# Patient Record
Sex: Female | Born: 1970 | Race: Black or African American | Hispanic: No | Smoking: Never smoker
Health system: Southern US, Community
[De-identification: ages and names within clinical notes are randomized; demographics above are authoritative.]

## PROBLEM LIST (undated history)

## (undated) ENCOUNTER — Emergency Department (HOSPITAL_BASED_OUTPATIENT_CLINIC_OR_DEPARTMENT_OTHER): Admission: EM | Payer: BLUE CROSS/BLUE SHIELD | Source: Home / Self Care

## (undated) DIAGNOSIS — N809 Endometriosis, unspecified: Secondary | ICD-10-CM

## (undated) DIAGNOSIS — M549 Dorsalgia, unspecified: Secondary | ICD-10-CM

## (undated) DIAGNOSIS — I2699 Other pulmonary embolism without acute cor pulmonale: Secondary | ICD-10-CM

## (undated) HISTORY — PX: BUNIONECTOMY: SHX129

## (undated) HISTORY — PX: HERNIA REPAIR: SHX51

## (undated) HISTORY — PX: LAPAROSCOPY: SHX197

## (undated) HISTORY — PX: ABDOMINAL HYSTERECTOMY: SHX81

---

## 2009-03-11 ENCOUNTER — Emergency Department (HOSPITAL_BASED_OUTPATIENT_CLINIC_OR_DEPARTMENT_OTHER): Admission: EM | Admit: 2009-03-11 | Discharge: 2009-03-11 | Payer: Self-pay | Admitting: Emergency Medicine

## 2009-03-11 ENCOUNTER — Ambulatory Visit: Payer: Self-pay | Admitting: Radiology

## 2009-09-19 ENCOUNTER — Ambulatory Visit: Payer: Self-pay | Admitting: Radiology

## 2009-09-19 ENCOUNTER — Emergency Department (HOSPITAL_BASED_OUTPATIENT_CLINIC_OR_DEPARTMENT_OTHER): Admission: EM | Admit: 2009-09-19 | Discharge: 2009-09-19 | Payer: Self-pay | Admitting: Emergency Medicine

## 2009-11-13 ENCOUNTER — Emergency Department (HOSPITAL_BASED_OUTPATIENT_CLINIC_OR_DEPARTMENT_OTHER): Admission: EM | Admit: 2009-11-13 | Discharge: 2009-11-13 | Payer: Self-pay | Admitting: Emergency Medicine

## 2010-08-10 LAB — GLUCOSE, CAPILLARY
Glucose-Capillary: 102 mg/dL — ABNORMAL HIGH (ref 70–99)
Glucose-Capillary: 96 mg/dL (ref 70–99)

## 2010-08-10 LAB — POCT CARDIAC MARKERS
CKMB, poc: 1.7 ng/mL (ref 1.0–8.0)
Myoglobin, poc: 127 ng/mL (ref 12–200)
Troponin i, poc: <0.05 ng/mL (ref 0.00–0.09)

## 2010-08-10 LAB — BASIC METABOLIC PANEL
BUN: 11 mg/dL (ref 6–23)
Calcium: 9.7 mg/dL (ref 8.4–10.5)
Creatinine, Ser: 0.7 mg/dL (ref 0.4–1.2)
GFR calc non Af Amer: >60 mL/min (ref >60)
Potassium: 3.8 mEq/L (ref 3.5–5.1)

## 2010-08-10 LAB — URINALYSIS, ROUTINE W REFLEX MICROSCOPIC
Bilirubin Urine: NEGATIVE
Leukocytes, UA: NEGATIVE
pH: 6 (ref 5.0–8.0)

## 2010-08-10 LAB — URINE MICROSCOPIC-ADD ON

## 2010-08-10 LAB — POCT TOXICOLOGY PANEL

## 2010-08-10 LAB — CBC
HCT: 37.7 % (ref 36.0–46.0)
MCHC: 32.8 g/dL (ref 30.0–36.0)
RBC: 4.38 MIL/uL (ref 3.87–5.11)
WBC: 6.9 10*3/uL (ref 4.0–10.5)

## 2010-08-10 LAB — URINE CULTURE

## 2010-08-10 LAB — DIFFERENTIAL
Eosinophils Absolute: 0 10*3/uL (ref 0.0–0.7)
Lymphs Abs: 2.5 10*3/uL (ref 0.7–4.0)
Monocytes Absolute: 0.5 10*3/uL (ref 0.1–1.0)
Monocytes Relative: 8 % (ref 3–12)

## 2010-09-18 ENCOUNTER — Emergency Department (HOSPITAL_BASED_OUTPATIENT_CLINIC_OR_DEPARTMENT_OTHER)
Admission: EM | Admit: 2010-09-18 | Discharge: 2010-09-18 | Disposition: A | Discharge status: Home / Self Care | Payer: Medicaid Other | Attending: Emergency Medicine | Admitting: Emergency Medicine

## 2010-09-18 ENCOUNTER — Emergency Department (INDEPENDENT_AMBULATORY_CARE_PROVIDER_SITE_OTHER): Payer: Medicaid Other

## 2010-09-18 DIAGNOSIS — M25579 Pain in unspecified ankle and joints of unspecified foot: Secondary | ICD-10-CM | POA: Insufficient documentation

## 2010-09-18 DIAGNOSIS — G8929 Other chronic pain: Secondary | ICD-10-CM | POA: Insufficient documentation

## 2010-09-18 DIAGNOSIS — M79609 Pain in unspecified limb: Secondary | ICD-10-CM

## 2011-05-29 ENCOUNTER — Encounter: Payer: Self-pay | Admitting: *Deleted

## 2011-05-29 ENCOUNTER — Emergency Department (HOSPITAL_BASED_OUTPATIENT_CLINIC_OR_DEPARTMENT_OTHER)
Admission: EM | Admit: 2011-05-29 | Discharge: 2011-05-29 | Disposition: A | Discharge status: Home / Self Care | Payer: Self-pay | Attending: Emergency Medicine | Admitting: Emergency Medicine

## 2011-05-29 DIAGNOSIS — G43909 Migraine, unspecified, not intractable, without status migrainosus: Secondary | ICD-10-CM | POA: Insufficient documentation

## 2011-05-29 MED ORDER — PROMETHAZINE HCL 25 MG/ML IJ SOLN
25.0000 mg | Freq: Once | INTRAMUSCULAR | Status: AC
  Administered 2011-05-29: 25 mg via INTRAVENOUS
  Filled 2011-05-29 (×2): qty 1

## 2011-05-29 MED ORDER — SODIUM CHLORIDE 0.9 % IV SOLN: INTRAVENOUS | Status: DC

## 2011-05-29 MED ORDER — METHYLPREDNISOLONE SODIUM SUCC 125 MG IJ SOLR
125.0000 mg | Freq: Once | INTRAMUSCULAR | Status: AC
  Administered 2011-05-29: 125 mg via INTRAVENOUS
  Filled 2011-05-29: qty 2

## 2011-05-29 MED ORDER — DIPHENHYDRAMINE HCL 50 MG/ML IJ SOLN
25.0000 mg | Freq: Once | INTRAMUSCULAR | Status: AC
  Administered 2011-05-29: 25 mg via INTRAVENOUS
  Filled 2011-05-29: qty 1

## 2011-05-29 MED ORDER — SODIUM CHLORIDE 0.9 % IV BOLUS (SEPSIS)
1000.0000 mL | Freq: Once | INTRAVENOUS | Status: AC
  Administered 2011-05-29: 1000 mL via INTRAVENOUS

## 2011-05-29 MED ORDER — IBUPROFEN 800 MG PO TABS: 800.0000 mg | ORAL_TABLET | Freq: Three times a day (TID) | ORAL | Status: AC | PRN

## 2011-05-29 NOTE — ED Notes (Signed)
Pt has hx of "paraplegic migraine" Had to be hospitalized for same. Has had this one for 4 days+ nausea

## 2011-05-29 NOTE — ED Provider Notes (Signed)
History   This chart was scribed for Felisa Bonier, MD by Charolett Bumpers . The patient was seen in room MH07/MH07 and the patient's care was started at 8:58pm.  CSN: [id number]  Arrival date & time 05/29/11  8469   First MD Initiated Contact with Patient 05/29/11 2018      Chief Complaint  Patient presents with  . Migraine    (Consider location/radiation/quality/duration/timing/severity/associated sxs/prior treatment) HPI Ellen Hood is a 41 y.o. female who presents to the Emergency Department complaining of a constant, moderate to severe headache with an onset of 4 days ago. Patient reports associated nausea. Patient denies weakness in extremities or speech deficits. Patient reports she is not taking any current medication for the headache. Patient reports having one dx paraplegic migraine last year. Patient also notes she has been without hormone replacement therapy for the past couple of weeks.     Past Medical History  Diagnosis Date  . Migraine     Past Surgical History  Procedure Date  . Abdominal hysterectomy   . Cesarean section   . Hernia repair   . Laparoscopy     History reviewed. No pertinent family history.  History  Substance Use Topics  . Smoking status: Never Smoker   . Smokeless tobacco: Not on file  . Alcohol Use: No    OB History    Grav Para Term Preterm Abortions TAB SAB Ect Mult Living                  Review of Systems A complete 10 system review of systems was obtained and is otherwise negative except as noted in the HPI and PMH.   Allergies  Review of patient's allergies indicates no known allergies.  Home Medications   Current Outpatient Rx  Name Route Sig Dispense Refill  . DIPHENHYDRAMINE HCL 25 MG PO TABS Oral Take 25 mg by mouth at bedtime.      . IBUPROFEN 200 MG PO TABS Oral Take 800 mg by mouth every 6 (six) hours as needed. For pain     . ADULT MULTIVITAMIN W/MINERALS CH Oral Take 1 tablet by mouth daily.       Marland Kitchen VITAMIN B-12 1000 MCG PO TABS Oral Take 1,000 mcg by mouth daily.      Marland Kitchen ESTRADIOL ACETATE 0.05 MG/24HR VA RING Vaginal Place 1 each vaginally every 3 (three) months.        BP 116/73  Pulse 80  Temp(Src) 98.3 F (36.8 C) (Oral)  Resp 20  Ht 5\' 6"  (1.676 m)  Wt 190 lb (86.183 kg)  BMI 30.67 kg/m2  SpO2 98%  Physical Exam  Nursing note and vitals reviewed. Constitutional: She is oriented to person, place, and time. She appears well-developed and well-nourished. No distress.  HENT:  Head: Normocephalic and atraumatic.  Eyes: EOM are normal. Pupils are equal, round, and reactive to light.  Fundoscopic exam:      The right eye shows no papilledema.       The left eye shows no papilledema.  Neck: Neck supple. No tracheal deviation present.  Cardiovascular: Normal rate.   Pulmonary/Chest: Effort normal. No respiratory distress.  Abdominal: Soft. She exhibits no distension.  Musculoskeletal: Normal range of motion. She exhibits no edema.  Neurological: She is alert and oriented to person, place, and time. She has normal reflexes. No cranial nerve deficit or sensory deficit.       Equal grip strength bilaterally, normal symmetrically bicep and patellar reflexes. Normal  finger nose finger test bilaterally. Normal coordination with heel to shin test. Negative Romberg's.    Skin: Skin is warm and dry.  Psychiatric: She has a normal mood and affect. Her behavior is normal.    ED Course  Procedures (including critical care time)  DIAGNOSTIC STUDIES: Oxygen Saturation is 98% on room air, normal by my interpretation.    COORDINATION OF CARE:  10:48 PM The patient reports significant improvement in her headache at this time and reports that it present it is approximately a 5/10 in intensity, tolerable for the patient. She feels as though she is ready to go home and rest. I have instructed her to call her neurologist's office tomorrow to discuss migraine specialty medications to be  prescribed. The patient states her understanding of and agreement with the plan of care.   Labs Reviewed - No data to display No results found.   No diagnosis found.    MDM  Patient with a h/o migraine headaches. Non-focal neurologic exam. No CT scan of the head is needed. I will attempt to break the patient's migraine with rescue medications.     I personally performed the services described in this documentation, which was scribed in my presence. The recorded information has been reviewed and considered.      Felisa Bonier, MD 05/29/11 2249

## 2013-02-25 ENCOUNTER — Encounter (HOSPITAL_BASED_OUTPATIENT_CLINIC_OR_DEPARTMENT_OTHER): Payer: Self-pay

## 2013-02-25 ENCOUNTER — Emergency Department (HOSPITAL_BASED_OUTPATIENT_CLINIC_OR_DEPARTMENT_OTHER)
Admission: EM | Admit: 2013-02-25 | Discharge: 2013-02-25 | Disposition: A | Discharge status: Home / Self Care | Payer: Medicaid Other | Attending: Emergency Medicine | Admitting: Emergency Medicine

## 2013-02-25 DIAGNOSIS — W57XXXA Bitten or stung by nonvenomous insect and other nonvenomous arthropods, initial encounter: Secondary | ICD-10-CM | POA: Insufficient documentation

## 2013-02-25 DIAGNOSIS — S1096XA Insect bite of unspecified part of neck, initial encounter: Secondary | ICD-10-CM | POA: Insufficient documentation

## 2013-02-25 DIAGNOSIS — Z8679 Personal history of other diseases of the circulatory system: Secondary | ICD-10-CM | POA: Insufficient documentation

## 2013-02-25 DIAGNOSIS — Y929 Unspecified place or not applicable: Secondary | ICD-10-CM | POA: Insufficient documentation

## 2013-02-25 DIAGNOSIS — R21 Rash and other nonspecific skin eruption: Secondary | ICD-10-CM | POA: Insufficient documentation

## 2013-02-25 DIAGNOSIS — L089 Local infection of the skin and subcutaneous tissue, unspecified: Secondary | ICD-10-CM

## 2013-02-25 DIAGNOSIS — Y939 Activity, unspecified: Secondary | ICD-10-CM | POA: Insufficient documentation

## 2013-02-25 MED ORDER — CEPHALEXIN 500 MG PO CAPS: 500.0000 mg | ORAL_CAPSULE | Freq: Four times a day (QID) | ORAL | Status: DC

## 2013-02-25 NOTE — ED Notes (Signed)
States she was bitten by a bug on right side of neck Saturday.  Redness and pain at site.

## 2013-02-25 NOTE — ED Provider Notes (Addendum)
CSN: [id number]     Arrival date & time 02/25/13  0808 History   First MD Initiated Contact with Patient 02/25/13 [phone]     Chief Complaint  Patient presents with  . Insect Bite   (Consider location/radiation/quality/duration/timing/severity/associated sxs/prior Treatment) Patient is a 42 y.o. female presenting with rash.  Rash Location:  Head/neck Head/neck rash location:  R neck Quality: painful and redness   Pain details:    Quality:  Itching and sharp   Severity:  Moderate   Onset quality:  Gradual   Duration:  2 days   Timing:  Constant   Progression:  Worsening Severity:  Moderate Onset quality:  Gradual Duration:  2 days Timing:  Constant Progression:  Worsening Chronicity:  New Context comment:  Insect bite Relieved by:  Nothing Worsened by:  Nothing tried Ineffective treatments: Benadryl. Associated symptoms: no abdominal pain, no diarrhea, no fever, no hoarse voice, no nausea, no shortness of breath, no sore throat, no throat swelling and not vomiting     Past Medical History  Diagnosis Date  . Migraine    Past Surgical History  Procedure Laterality Date  . Abdominal hysterectomy    . Cesarean section    . Hernia repair    . Laparoscopy     No family history on file. History  Substance Use Topics  . Smoking status: Never Smoker   . Smokeless tobacco: Not on file  . Alcohol Use: No   OB History   Grav Para Term Preterm Abortions TAB SAB Ect Mult Living                 Review of Systems  Constitutional: Negative for fever.  HENT: Negative for congestion, sore throat and hoarse voice.   Respiratory: Negative for cough and shortness of breath.   Cardiovascular: Negative for chest pain.  Gastrointestinal: Negative for nausea, vomiting, abdominal pain and diarrhea.  Skin: Positive for rash.  All other systems reviewed and are negative.    Allergies  Review of patient's allergies indicates no known allergies.  Home Medications   Current  Outpatient Rx  Name  Route  Sig  Dispense  Refill  . diphenhydrAMINE (BENADRYL) 25 MG tablet   Oral   Take 25 mg by mouth at bedtime.           Marland Kitchen ibuprofen (ADVIL,MOTRIN) 200 MG tablet   Oral   Take 800 mg by mouth every 6 (six) hours as needed. For pain          . Multiple Vitamin (MULITIVITAMIN WITH MINERALS) TABS   Oral   Take 1 tablet by mouth daily.           . vitamin B-12 (CYANOCOBALAMIN) 1000 MCG tablet   Oral   Take 1,000 mcg by mouth daily.            BP 122/75  Pulse 76  Temp(Src) 98.3 F (36.8 C) (Oral)  Resp 18  SpO2 100% Physical Exam  Nursing note and vitals reviewed. Constitutional: She is oriented to person, place, and time. She appears well-developed and well-nourished. No distress.  HENT:  Head: Normocephalic and atraumatic.    Eyes: Conjunctivae are normal. No scleral icterus.  Neck: Neck supple.  Cardiovascular: Normal rate and intact distal pulses.   Pulmonary/Chest: Effort normal. No stridor. No respiratory distress.  Abdominal: Normal appearance. She exhibits no distension.  Neurological: She is alert and oriented to person, place, and time.  Skin: Skin is warm and dry. No rash noted.  Psychiatric: She has a normal mood and affect. Her behavior is normal.    ED Course  Procedures (including critical care time) Labs Review Labs Reviewed - No data to display Imaging Review No results found.  MDM   1. Insect bite of neck, infected, initial encounter    42 year old female with insect bite 2 days ago now presenting with worsening swelling and redness. No fevers. On exam head is mild erythema spreading from the area. Will treat with Keflex.    Candyce Churn, MD 02/25/13 1610  Candyce Churn, MD 03/05/13 [phone]

## 2013-05-02 ENCOUNTER — Emergency Department (HOSPITAL_BASED_OUTPATIENT_CLINIC_OR_DEPARTMENT_OTHER)
Admission: EM | Admit: 2013-05-02 | Discharge: 2013-05-02 | Disposition: A | Discharge status: Home / Self Care | Payer: Self-pay | Attending: Emergency Medicine | Admitting: Emergency Medicine

## 2013-05-02 ENCOUNTER — Emergency Department (HOSPITAL_BASED_OUTPATIENT_CLINIC_OR_DEPARTMENT_OTHER): Payer: Self-pay

## 2013-05-02 ENCOUNTER — Encounter (HOSPITAL_BASED_OUTPATIENT_CLINIC_OR_DEPARTMENT_OTHER): Payer: Self-pay | Admitting: Emergency Medicine

## 2013-05-02 DIAGNOSIS — R071 Chest pain on breathing: Secondary | ICD-10-CM | POA: Insufficient documentation

## 2013-05-02 DIAGNOSIS — G43909 Migraine, unspecified, not intractable, without status migrainosus: Secondary | ICD-10-CM | POA: Insufficient documentation

## 2013-05-02 DIAGNOSIS — R0789 Other chest pain: Secondary | ICD-10-CM

## 2013-05-02 DIAGNOSIS — Z792 Long term (current) use of antibiotics: Secondary | ICD-10-CM | POA: Insufficient documentation

## 2013-05-02 DIAGNOSIS — Z79899 Other long term (current) drug therapy: Secondary | ICD-10-CM | POA: Insufficient documentation

## 2013-05-02 LAB — COMPREHENSIVE METABOLIC PANEL
ALT: 20 U/L (ref 0–35)
AST: 17 U/L (ref 0–37)
Albumin: 3.4 g/dL — ABNORMAL LOW (ref 3.5–5.2)
Alkaline Phosphatase: 121 U/L — ABNORMAL HIGH (ref 39–117)
CO2: 26 mEq/L (ref 19–32)
GFR calc Af Amer: >90 mL/min (ref >90)
Glucose, Bld: 99 mg/dL (ref 70–99)
Potassium: 4.1 mEq/L (ref 3.5–5.1)
Sodium: 139 mEq/L (ref 135–145)
Total Protein: 8 g/dL (ref 6.0–8.3)

## 2013-05-02 LAB — CBC WITH DIFFERENTIAL/PLATELET
Eosinophils Relative: 2 % (ref 0–5)
Hemoglobin: 12.1 g/dL (ref 12.0–15.0)
Lymphocytes Relative: 37 % (ref 12–46)
Lymphs Abs: 2.8 10*3/uL (ref 0.7–4.0)
MCH: 28.3 pg (ref 26.0–34.0)
MCHC: 32.5 g/dL (ref 30.0–36.0)
MCV: 86.9 fL (ref 78.0–100.0)
Monocytes Relative: 9 % (ref 3–12)

## 2013-05-02 MED ORDER — HYDROCODONE-ACETAMINOPHEN 5-325 MG PO TABS: 2.0000 | ORAL_TABLET | ORAL | Status: DC | PRN

## 2013-05-02 MED ORDER — HYDROCODONE-ACETAMINOPHEN 5-325 MG PO TABS
2.0000 | ORAL_TABLET | Freq: Once | ORAL | Status: AC
  Administered 2013-05-02: 2 via ORAL
  Filled 2013-05-02: qty 2

## 2013-05-02 MED ORDER — IBUPROFEN 800 MG PO TABS: 800.0000 mg | ORAL_TABLET | Freq: Three times a day (TID) | ORAL | Status: DC

## 2013-05-02 NOTE — ED Notes (Signed)
Low central cp since yesterday.  Pain also in shoulders, worse in left, and upper back.  Constant. Nausea, no vomiting. Worse with deep breath and movement.  Tender to touch.

## 2013-05-02 NOTE — ED Provider Notes (Signed)
CSN: [id number]     Arrival date & time 05/02/13  1836 History   First MD Initiated Contact with Patient 05/02/13 1851     Chief Complaint  Patient presents with  . Chest Pain   (Consider location/radiation/quality/duration/timing/severity/associated sxs/prior Treatment) Patient is a 42 y.o. female presenting with chest pain. The history is provided by the patient. No language interpreter was used.  Chest Pain Pain location:  L chest Pain quality: aching   Pain radiates to:  L shoulder Pain radiates to the back: yes   Pain severity:  Moderate Duration:  1 day Timing:  Constant Progression:  Worsening Chronicity:  New Relieved by:  Nothing Worsened by:  Nothing tried Ineffective treatments:  None tried Associated symptoms: no abdominal pain   pain started yesterday.   Pt has some pain with movement.  Pt is in Nursing school doing clinicals.  Pt denies lifting or straining  Past Medical History  Diagnosis Date  . Migraine    Past Surgical History  Procedure Laterality Date  . Abdominal hysterectomy    . Cesarean section    . Hernia repair    . Laparoscopy     No family history on file. History  Substance Use Topics  . Smoking status: Never Smoker   . Smokeless tobacco: Not on file  . Alcohol Use: No   OB History   Grav Para Term Preterm Abortions TAB SAB Ect Mult Living                 Review of Systems  Cardiovascular: Positive for chest pain.  Gastrointestinal: Negative for abdominal pain.  All other systems reviewed and are negative.    Allergies  Review of patient's allergies indicates no known allergies.  Home Medications   Current Outpatient Rx  Name  Route  Sig  Dispense  Refill  . cephALEXin (KEFLEX) 500 MG capsule   Oral   Take 1 capsule (500 mg total) by mouth 4 (four) times daily.   28 capsule   0   . diphenhydrAMINE (BENADRYL) 25 MG tablet   Oral   Take 25 mg by mouth at bedtime.           Marland Kitchen ibuprofen (ADVIL,MOTRIN) 200 MG tablet  Oral   Take 800 mg by mouth every 6 (six) hours as needed. For pain          . Multiple Vitamin (MULITIVITAMIN WITH MINERALS) TABS   Oral   Take 1 tablet by mouth daily.           . vitamin B-12 (CYANOCOBALAMIN) 1000 MCG tablet   Oral   Take 1,000 mcg by mouth daily.            BP 127/93  Pulse 80  Temp(Src) 97.9 F (36.6 C) (Oral)  Resp 16  Ht 5' 5.5" (1.664 m)  Wt 189 lb (85.73 kg)  BMI 30.96 kg/m2  SpO2 99% Physical Exam  Nursing note and vitals reviewed. Constitutional: She is oriented to person, place, and time. She appears well-developed and well-nourished.  HENT:  Head: Normocephalic.  Right Ear: External ear normal.  Left Ear: External ear normal.  Mouth/Throat: Oropharynx is clear and moist.  Eyes: EOM are normal. Pupils are equal, round, and reactive to light.  Neck: Normal range of motion.  Cardiovascular: Normal rate.   Pulmonary/Chest: Effort normal. No respiratory distress.  Abdominal: Soft. She exhibits no distension.  Musculoskeletal: Normal range of motion.  Neurological: She is alert and oriented to person, place,  and time.  Skin: Skin is warm.  Psychiatric: She has a normal mood and affect.    ED Course  Procedures (including critical care time) Labs Review Labs Reviewed  CBC WITH DIFFERENTIAL  COMPREHENSIVE METABOLIC PANEL  TROPONIN I  D-DIMER, QUANTITATIVE   Imaging Review No results found.  EKG Interpretation    Date/Time:  Thursday May 02 2013 18:48:54 EST Ventricular Rate:  83 PR Interval:  140 QRS Duration: 78 QT Interval:  396 QTC Calculation: 465 R Axis:   62 Text Interpretation:  Normal sinus rhythm Nonspecific T wave abnormality Abnormal ECG No significant change since last tracing Confirmed by POLLINA  MD, CHRISTOPHER ([id number]) on 05/02/2013 7:48:36 PM            MDM   1. Chest wall pain     Pt has a normal ekg and chest xray.  ddimer is negative,  Troponin is negative,   I suspect muscular pain.   Pt  given hydrocodone.  I advised follow up with Dr. Roseanne Reno.   Pt given rx for ibuprofen and hydrocodone   Elson Areas, PA-C 05/02/13 2052

## 2013-05-02 NOTE — ED Notes (Signed)
Patient transported to X-ray via stretcher per tech. 

## 2013-05-02 NOTE — ED Notes (Signed)
PA at bedside.

## 2013-05-03 NOTE — ED Provider Notes (Signed)
Medical screening examination/treatment/procedure(s) were performed by non-physician practitioner and as supervising physician I was immediately available for consultation/collaboration.  EKG Interpretation   None          Gilda Crease, MD 05/03/13 2136

## 2013-05-23 ENCOUNTER — Encounter (HOSPITAL_BASED_OUTPATIENT_CLINIC_OR_DEPARTMENT_OTHER): Payer: Self-pay | Admitting: Emergency Medicine

## 2013-05-23 ENCOUNTER — Emergency Department (HOSPITAL_BASED_OUTPATIENT_CLINIC_OR_DEPARTMENT_OTHER)
Admission: EM | Admit: 2013-05-23 | Discharge: 2013-05-23 | Disposition: A | Discharge status: Home / Self Care | Payer: BC Managed Care – PPO | Attending: Emergency Medicine | Admitting: Emergency Medicine

## 2013-05-23 ENCOUNTER — Emergency Department (HOSPITAL_BASED_OUTPATIENT_CLINIC_OR_DEPARTMENT_OTHER): Payer: BC Managed Care – PPO

## 2013-05-23 DIAGNOSIS — G43909 Migraine, unspecified, not intractable, without status migrainosus: Secondary | ICD-10-CM | POA: Insufficient documentation

## 2013-05-23 DIAGNOSIS — Z792 Long term (current) use of antibiotics: Secondary | ICD-10-CM | POA: Insufficient documentation

## 2013-05-23 DIAGNOSIS — R079 Chest pain, unspecified: Secondary | ICD-10-CM | POA: Insufficient documentation

## 2013-05-23 DIAGNOSIS — J069 Acute upper respiratory infection, unspecified: Secondary | ICD-10-CM | POA: Insufficient documentation

## 2013-05-23 DIAGNOSIS — Z79899 Other long term (current) drug therapy: Secondary | ICD-10-CM | POA: Insufficient documentation

## 2013-05-23 MED ORDER — BENZONATATE 100 MG PO CAPS
200.0000 mg | ORAL_CAPSULE | Freq: Once | ORAL | Status: AC
  Administered 2013-05-23: 200 mg via ORAL
  Filled 2013-05-23: qty 2

## 2013-05-23 MED ORDER — IBUPROFEN 800 MG PO TABS: 800.0000 mg | ORAL_TABLET | Freq: Three times a day (TID) | ORAL | Status: DC

## 2013-05-23 MED ORDER — NAPROXEN 250 MG PO TABS
500.0000 mg | ORAL_TABLET | Freq: Once | ORAL | Status: AC
  Administered 2013-05-23: 500 mg via ORAL
  Filled 2013-05-23: qty 2

## 2013-05-23 MED ORDER — MOMETASONE FUROATE 50 MCG/ACT NA SUSP: 2.0000 | Freq: Every day | NASAL | Status: DC

## 2013-05-23 NOTE — ED Provider Notes (Signed)
CSN: [id number]     Arrival date & time 05/23/13  2035 History  This chart was scribed for Aira Sallade Smitty Cords, MD by Blanchard Kelch, ED Scribe. The patient was seen in room MH06/MH06. Patient's care was started at 11:06 PM.    Chief Complaint  Patient presents with  . Cough    Patient is a 43 y.o. female presenting with cough. The history is provided by the patient. No language interpreter was used.  Cough Cough characteristics:  Dry Severity:  Mild Onset quality:  Gradual Duration:  3 days Timing:  Constant Progression:  Unchanged Chronicity:  New Context: upper respiratory infection   Relieved by:  Nothing Worsened by:  Nothing tried Ineffective treatments:  Decongestant (Mucinex DM, Theraflu) Associated symptoms: no chills, no diaphoresis, no fever, no myalgias and no wheezing   Risk factors: no chemical exposure     HPI Comments: Ellen Hood is a 43 y.o. female who presents to the Emergency Department complaining of constant cough that began three days ago. She has associated chest pain from coughing, congestion, post nasal drip, low grade fever, headache and nausea. She reports taking Mucinex DM and Theraflu without relief.    Past Medical History  Diagnosis Date  . Migraine    Past Surgical History  Procedure Laterality Date  . Abdominal hysterectomy    . Cesarean section    . Hernia repair    . Laparoscopy     No family history on file. History  Substance Use Topics  . Smoking status: Never Smoker   . Smokeless tobacco: Not on file  . Alcohol Use: No   OB History   Grav Para Term Preterm Abortions TAB SAB Ect Mult Living                 Review of Systems  Constitutional: Negative for fever, chills and diaphoresis.  HENT: Positive for congestion and postnasal drip.   Respiratory: Positive for cough. Negative for wheezing.   Gastrointestinal: Positive for nausea.  Musculoskeletal: Negative for myalgias.  All other systems reviewed and are  negative.    Allergies  Review of patient's allergies indicates no known allergies.  Home Medications   Current Outpatient Rx  Name  Route  Sig  Dispense  Refill  . cephALEXin (KEFLEX) 500 MG capsule   Oral   Take 1 capsule (500 mg total) by mouth 4 (four) times daily.   28 capsule   0   . diphenhydrAMINE (BENADRYL) 25 MG tablet   Oral   Take 25 mg by mouth at bedtime.           Marland Kitchen HYDROcodone-acetaminophen (NORCO/VICODIN) 5-325 MG per tablet   Oral   Take 2 tablets by mouth every 4 (four) hours as needed.   20 tablet   0   . ibuprofen (ADVIL,MOTRIN) 200 MG tablet   Oral   Take 800 mg by mouth every 6 (six) hours as needed. For pain          . ibuprofen (ADVIL,MOTRIN) 800 MG tablet   Oral   Take 1 tablet (800 mg total) by mouth 3 (three) times daily.   21 tablet   0   . Multiple Vitamin (MULITIVITAMIN WITH MINERALS) TABS   Oral   Take 1 tablet by mouth daily.           . vitamin B-12 (CYANOCOBALAMIN) 1000 MCG tablet   Oral   Take 1,000 mcg by mouth daily.  Triage Vitals: BP 112/98  Pulse 96  Temp(Src) 99 F (37.2 C) (Oral)  Resp 20  Ht 5\' 5"  (1.651 m)  Wt 189 lb (85.73 kg)  BMI 31.45 kg/m2  Physical Exam  Nursing note and vitals reviewed. Constitutional: She is oriented to person, place, and time. She appears well-developed and well-nourished.  HENT:  Head: Normocephalic and atraumatic.  Mouth/Throat: Oropharynx is clear and moist.  Post nasal drip down back of throat.   Eyes: Pupils are equal, round, and reactive to light.  Neck: Normal range of motion.  Cardiovascular: Normal rate and regular rhythm.   Pulmonary/Chest: Effort normal. No stridor. No respiratory distress. She has no wheezes. She has no rales.  Abdominal: Soft. Bowel sounds are normal. There is no tenderness. There is no rebound and no guarding.  Musculoskeletal: Normal range of motion.  Lymphadenopathy:    She has no cervical adenopathy.  Neurological: She is  alert and oriented to person, place, and time.  Skin: Skin is warm and dry.  Psychiatric: She has a normal mood and affect.    ED Course  Procedures (including critical care time)  DIAGNOSTIC STUDIES: Oxygen Saturation is 97% on room air, adequate by my interpretation.    COORDINATION OF CARE: 11:09 PM -Will order naproxen for pain. Recommend continued use of Mucinex as well as Tylenol for symptomatic relief. Patient verbalizes understanding and agrees with treatment plan.    Labs Review Labs Reviewed - No data to display Imaging Review Dg Chest 2 View  05/23/2013   CLINICAL DATA:  Cough and congestion for 3 days.  EXAM: CHEST  2 VIEW  COMPARISON:  05/02/2013  FINDINGS: Shallow inspiration with elevation of the right hemidiaphragm, stable. The heart size and mediastinal contours are within normal limits. Both lungs are clear. The visualized skeletal structures are unremarkable.  IMPRESSION: No active cardiopulmonary disease.   Electronically Signed   By: Burman NievesWilliam  Stevens M.D.   On: 05/23/2013 21:15    EKG Interpretation   None       MDM  No diagnosis found. URI treat symptomatically.    I personally performed the services described in this documentation, which was scribed in my presence. The recorded information has been reviewed and is accurate.     Jasmine AweApril K Aniyia Rane-Rasch, MD 05/24/13 [phone]

## 2013-05-23 NOTE — Discharge Instructions (Signed)
Cough, Adult  A cough is a reflex. It helps you clear your throat and airways. A cough can help heal your body. A cough can last 2 or 3 weeks (acute) or may last more than 8 weeks (chronic). Some common causes of a cough can include an infection, allergy, or a cold. HOME CARE  Only take medicine as told by your doctor.  If given, take your medicines (antibiotics) as told. Finish them even if you start to feel better.  Use a cold steam vaporizer or humidier in your home. This can help loosen thick spit (secretions).  Sleep so you are almost sitting up (semi-upright). Use pillows to do this. This helps reduce coughing.  Rest as needed.  Stop smoking if you smoke. GET HELP RIGHT AWAY IF:  You have yellowish-Talamante fluid (pus) in your thick spit.  Your cough gets worse.  Your medicine does not reduce coughing, and you are losing sleep.  You cough up blood.  You have trouble breathing.  Your pain gets worse and medicine does not help.  You have a fever. MAKE SURE YOU:   Understand these instructions.  Will watch your condition.  Will get help right away if you are not doing well or get worse. Document Released: 01/20/2011 Document Revised: 08/01/2011 Document Reviewed: 01/20/2011 [employer] Patient Information 2014 [employer].  

## 2013-05-23 NOTE — ED Notes (Signed)
Cough chest congestion for a few days. Has been taking OTC cough medication with no relief.

## 2013-05-23 NOTE — ED Notes (Signed)
Cough x few days  Also ha and chest pain from coughing,,  Productive at times, yellow

## 2013-06-22 ENCOUNTER — Encounter (HOSPITAL_BASED_OUTPATIENT_CLINIC_OR_DEPARTMENT_OTHER): Payer: Self-pay | Admitting: Emergency Medicine

## 2013-06-22 ENCOUNTER — Emergency Department (HOSPITAL_BASED_OUTPATIENT_CLINIC_OR_DEPARTMENT_OTHER)
Admission: EM | Admit: 2013-06-22 | Discharge: 2013-06-22 | Disposition: A | Discharge status: Home / Self Care | Payer: BC Managed Care – PPO | Attending: Emergency Medicine | Admitting: Emergency Medicine

## 2013-06-22 DIAGNOSIS — Z79899 Other long term (current) drug therapy: Secondary | ICD-10-CM | POA: Insufficient documentation

## 2013-06-22 DIAGNOSIS — Z792 Long term (current) use of antibiotics: Secondary | ICD-10-CM | POA: Insufficient documentation

## 2013-06-22 DIAGNOSIS — K120 Recurrent oral aphthae: Secondary | ICD-10-CM | POA: Insufficient documentation

## 2013-06-22 DIAGNOSIS — Z791 Long term (current) use of non-steroidal anti-inflammatories (NSAID): Secondary | ICD-10-CM | POA: Insufficient documentation

## 2013-06-22 DIAGNOSIS — IMO0002 Reserved for concepts with insufficient information to code with codable children: Secondary | ICD-10-CM | POA: Insufficient documentation

## 2013-06-22 DIAGNOSIS — J029 Acute pharyngitis, unspecified: Secondary | ICD-10-CM | POA: Insufficient documentation

## 2013-06-22 DIAGNOSIS — Z8679 Personal history of other diseases of the circulatory system: Secondary | ICD-10-CM | POA: Insufficient documentation

## 2013-06-22 MED ORDER — HYDROCODONE-ACETAMINOPHEN 5-325 MG PO TABS
1.0000 | ORAL_TABLET | Freq: Once | ORAL | Status: AC
Start: 2013-06-22 — End: 2013-06-22
  Administered 2013-06-22: 1 via ORAL
  Filled 2013-06-22: qty 1

## 2013-06-22 MED ORDER — PENICILLIN V POTASSIUM 250 MG PO TABS
500.0000 mg | ORAL_TABLET | Freq: Once | ORAL | Status: AC
  Administered 2013-06-22: 500 mg via ORAL
  Filled 2013-06-22: qty 2

## 2013-06-22 MED ORDER — PENICILLIN V POTASSIUM 500 MG PO TABS: 500.0000 mg | ORAL_TABLET | Freq: Three times a day (TID) | ORAL | Status: DC

## 2013-06-22 MED ORDER — HYDROCODONE-ACETAMINOPHEN 5-325 MG PO TABS: 1.0000 | ORAL_TABLET | ORAL | Status: DC | PRN

## 2013-06-22 NOTE — ED Notes (Signed)
C/o ulceration to the left side of her tongue on Thursday.  C/o not feeling well now, fever last night.  C/o body aches, sore throat.  Reports hurts to eat, swallow, talk.

## 2013-06-22 NOTE — Discharge Instructions (Signed)
Salt Water Gargle  This solution will help make your mouth and throat feel better.  HOME CARE INSTRUCTIONS    Mix 1 teaspoon of salt in 8 ounces of warm water.   Gargle with this solution as much or often as you need or as directed. Swish and gargle gently if you have any sores or wounds in your mouth.   Do not swallow this mixture.  Document Released: 02/11/2004 Document Revised: 08/01/2011 Document Reviewed: 07/04/2008  [employer] Patient Information 2014 [employer].

## 2013-06-22 NOTE — ED Provider Notes (Signed)
Medical screening examination/treatment/procedure(s) were performed by non-physician practitioner and as supervising physician I was immediately available for consultation/collaboration.  EKG Interpretation   None         Joliana Claflin B. Bernette MayersSheldon, MD 06/22/13 [phone]

## 2013-06-22 NOTE — ED Provider Notes (Signed)
CSN: [id number]     Arrival date & time 06/22/13  1755 History   First MD Initiated Contact with Patient 06/22/13 1841     Chief Complaint  Patient presents with  . Mouth Lesions   (Consider location/radiation/quality/duration/timing/severity/associated sxs/prior Treatment) Patient is a 42 y.o. female presenting with mouth sores. The history is provided by the patient. No language interpreter was used.  Mouth Lesions Location:  Buccal mucosa Buccal mucosa location:  L buccal mucosa Quality:  Red and painful Associated symptoms: sore throat   Associated symptoms: no congestion and no fever   Associated symptoms comment:  Swelling to left buccal surface since 3 days ago. She now has chills without fever and pain that extends to back of throat and neck. No facial swelling.    Past Medical History  Diagnosis Date  . Migraine    Past Surgical History  Procedure Laterality Date  . Abdominal hysterectomy    . Cesarean section    . Hernia repair    . Laparoscopy     No family history on file. History  Substance Use Topics  . Smoking status: Never Smoker   . Smokeless tobacco: Not on file  . Alcohol Use: No   OB History   Grav Para Term Preterm Abortions TAB SAB Ect Mult Living                 Review of Systems  Constitutional: Positive for chills. Negative for fever.  HENT: Positive for mouth sores and sore throat. Negative for congestion, dental problem and trouble swallowing.   Respiratory: Negative for shortness of breath.   Gastrointestinal: Negative for nausea and abdominal pain.  Musculoskeletal: Negative for myalgias.    Allergies  Review of patient's allergies indicates no known allergies.  Home Medications   Current Outpatient Rx  Name  Route  Sig  Dispense  Refill  . cephALEXin (KEFLEX) 500 MG capsule   Oral   Take 1 capsule (500 mg total) by mouth 4 (four) times daily.   28 capsule   0   . diphenhydrAMINE (BENADRYL) 25 MG tablet   Oral   Take 25 mg  by mouth at bedtime.           Marland Kitchen HYDROcodone-acetaminophen (NORCO/VICODIN) 5-325 MG per tablet   Oral   Take 2 tablets by mouth every 4 (four) hours as needed.   20 tablet   0   . ibuprofen (ADVIL,MOTRIN) 200 MG tablet   Oral   Take 800 mg by mouth every 6 (six) hours as needed. For pain          . ibuprofen (ADVIL,MOTRIN) 800 MG tablet   Oral   Take 1 tablet (800 mg total) by mouth 3 (three) times daily.   21 tablet   0   . ibuprofen (ADVIL,MOTRIN) 800 MG tablet   Oral   Take 1 tablet (800 mg total) by mouth 3 (three) times daily.   21 tablet   0   . mometasone (NASONEX) 50 MCG/ACT nasal spray   Nasal   Place 2 sprays into the nose daily.   17 g   0   . Multiple Vitamin (MULITIVITAMIN WITH MINERALS) TABS   Oral   Take 1 tablet by mouth daily.           . vitamin B-12 (CYANOCOBALAMIN) 1000 MCG tablet   Oral   Take 1,000 mcg by mouth daily.            BP 125/83  Pulse 90  Temp(Src) 98.5 F (36.9 C) (Oral)  Resp 21  SpO2 100% Physical Exam  Constitutional: She is oriented to person, place, and time. She appears well-developed and well-nourished.  HENT:  Mouth/Throat: Oropharynx is clear and moist. No oropharyngeal exudate.  Generally good dentition without gingival swelling or discrete abscess. Swollen area to left buccal surface that is red, soft, without induration or fluctuance. Tender.   Neck: Normal range of motion.  Pulmonary/Chest: Effort normal.  Lymphadenopathy:    She has no cervical adenopathy.  Neurological: She is alert and oriented to person, place, and time.  Skin: Skin is warm and dry.    ED Course  Procedures (including critical care time) Labs Review Labs Reviewed - No data to display Imaging Review No results found.  EKG Interpretation   None       MDM  No diagnosis found. 1. Mouth pain  Question early abscess, but otherwise uncomplicated presentation or oral sore.     Arnoldo HookerShari A Chassity Ludke, PA-C 06/22/13 1941

## 2014-02-10 ENCOUNTER — Encounter (HOSPITAL_BASED_OUTPATIENT_CLINIC_OR_DEPARTMENT_OTHER): Payer: Self-pay | Admitting: Emergency Medicine

## 2014-02-10 ENCOUNTER — Emergency Department (HOSPITAL_BASED_OUTPATIENT_CLINIC_OR_DEPARTMENT_OTHER): Payer: Medicaid Other

## 2014-02-10 ENCOUNTER — Emergency Department (HOSPITAL_BASED_OUTPATIENT_CLINIC_OR_DEPARTMENT_OTHER)
Admission: EM | Admit: 2014-02-10 | Discharge: 2014-02-10 | Disposition: A | Discharge status: Home / Self Care | Payer: Medicaid Other | Attending: Emergency Medicine | Admitting: Emergency Medicine

## 2014-02-10 DIAGNOSIS — M79609 Pain in unspecified limb: Secondary | ICD-10-CM | POA: Insufficient documentation

## 2014-02-10 DIAGNOSIS — IMO0002 Reserved for concepts with insufficient information to code with codable children: Secondary | ICD-10-CM | POA: Diagnosis not present

## 2014-02-10 DIAGNOSIS — Z8679 Personal history of other diseases of the circulatory system: Secondary | ICD-10-CM | POA: Diagnosis not present

## 2014-02-10 DIAGNOSIS — Z8742 Personal history of other diseases of the female genital tract: Secondary | ICD-10-CM | POA: Diagnosis not present

## 2014-02-10 DIAGNOSIS — Z792 Long term (current) use of antibiotics: Secondary | ICD-10-CM | POA: Insufficient documentation

## 2014-02-10 DIAGNOSIS — R0789 Other chest pain: Secondary | ICD-10-CM

## 2014-02-10 DIAGNOSIS — R071 Chest pain on breathing: Secondary | ICD-10-CM | POA: Diagnosis not present

## 2014-02-10 DIAGNOSIS — Z79899 Other long term (current) drug therapy: Secondary | ICD-10-CM | POA: Diagnosis not present

## 2014-02-10 HISTORY — DX: Endometriosis, unspecified: N80.9

## 2014-02-10 MED ORDER — HYDROCODONE-ACETAMINOPHEN 5-325 MG PO TABS: 1.0000 | ORAL_TABLET | ORAL | Status: DC | PRN

## 2014-02-10 MED ORDER — KETOROLAC TROMETHAMINE 60 MG/2ML IM SOLN
60.0000 mg | Freq: Once | INTRAMUSCULAR | Status: AC
  Administered 2014-02-10: 60 mg via INTRAMUSCULAR
  Filled 2014-02-10: qty 2

## 2014-02-10 NOTE — Discharge Instructions (Signed)
Ibuprofen 600 mg 3 times daily for the next 5 days.  Hydrocodone as prescribed as needed for pain not relieved with ibuprofen.  Return to the emergency department if your pain worsens, you develop difficulty breathing, or any other new and concerning symptoms.   Chest Wall Pain Chest wall pain is pain in or around the bones and muscles of your chest. It may take up to 6 weeks to get better. It may take longer if you must stay physically active in your work and activities.  CAUSES  Chest wall pain may happen on its own. However, it may be caused by:  A viral illness like the flu.  Injury.  Coughing.  Exercise.  Arthritis.  Fibromyalgia.  Shingles. HOME CARE INSTRUCTIONS   Avoid overtiring physical activity. Try not to strain or perform activities that cause pain. This includes any activities using your chest or your abdominal and side muscles, especially if heavy weights are used.  Put ice on the sore area.  Put ice in a plastic bag.  Place a towel between your skin and the bag.  Leave the ice on for 15-20 minutes per hour while awake for the first 2 days.  Only take over-the-counter or prescription medicines for pain, discomfort, or fever as directed by your caregiver. SEEK IMMEDIATE MEDICAL CARE IF:   Your pain increases, or you are very uncomfortable.  You have a fever.  Your chest pain becomes worse.  You have new, unexplained symptoms.  You have nausea or vomiting.  You feel sweaty or lightheaded.  You have a cough with phlegm (sputum), or you cough up blood. MAKE SURE YOU:   Understand these instructions.  Will watch your condition.  Will get help right away if you are not doing well or get worse. Document Released: 05/09/2005 Document Revised: 08/01/2011 Document Reviewed: 01/03/2011 [facility] Patient Information 2015 [place], [place]. This information is not intended to replace advice given to you by your health care provider. Make sure you discuss  any questions you have with your health care provider.

## 2014-02-10 NOTE — ED Notes (Signed)
C/o pain to entire left arm x 1 week-denies injury-pt appears to be having spasms with sudden onset pain and grabs arm during triage

## 2014-02-10 NOTE — ED Notes (Signed)
MD at bedside. 

## 2014-02-10 NOTE — ED Notes (Signed)
Patient transported to X-ray 

## 2014-02-10 NOTE — ED Provider Notes (Signed)
CSN: [id number]     Arrival date & time 02/10/14  2011 History  This chart was scribed for Geoffery Lyons, MD by Charline Bills, ED Scribe. The patient was seen in room MH10/MH10. Patient's care was started at 9:06 PM.   Chief Complaint  Patient presents with  . Arm Pain   Patient is a 43 y.o. female presenting with arm pain. The history is provided by the patient. No language interpreter was used.  Arm Pain This is a new problem. The current episode started more than 2 days ago. The problem has been gradually worsening. Pertinent negatives include no shortness of breath.   HPI Comments: Ellen Hood is a 43 y.o. female who presents to the Emergency Department complaining of constant L arm pain onset 1 week ago, worsened over the past 2 days. Pt states that pain starts in her chest and radiates down her entire R arm. She states that pain was intermittent a week ago but has progressed to constant. She describes the pain as a sharp sensation. Pain is nonexertional. Pt denies injury. She also denies SOB and numbness in lower extremities. No h/o similar symptoms.    Past Medical History  Diagnosis Date  . Migraine   . Endometriosis    Past Surgical History  Procedure Laterality Date  . Abdominal hysterectomy    . Cesarean section    . Hernia repair    . Laparoscopy     No family history on file. History  Substance Use Topics  . Smoking status: Never Smoker   . Smokeless tobacco: Not on file  . Alcohol Use: No   OB History   Grav Para Term Preterm Abortions TAB SAB Ect Mult Living                 Review of Systems  Respiratory: Negative for shortness of breath.   Musculoskeletal: Positive for myalgias.  Neurological: Negative for numbness.  All other systems reviewed and are negative.  Allergies  Review of patient's allergies indicates no known allergies.  Home Medications   Prior to Admission medications   Medication Sig Start Date End Date Taking? Authorizing Provider   cephALEXin (KEFLEX) 500 MG capsule Take 1 capsule (500 mg total) by mouth 4 (four) times daily. 02/25/13   Candyce Churn III, MD  diphenhydrAMINE (BENADRYL) 25 MG tablet Take 25 mg by mouth at bedtime.      Historical Provider, MD  HYDROcodone-acetaminophen (NORCO/VICODIN) 5-325 MG per tablet Take 2 tablets by mouth every 4 (four) hours as needed. 05/02/13   Elson Areas, PA-C  HYDROcodone-acetaminophen (NORCO/VICODIN) 5-325 MG per tablet Take 1-2 tablets by mouth every 4 (four) hours as needed for moderate pain. 06/22/13   Shari A Upstill, PA-C  ibuprofen (ADVIL,MOTRIN) 200 MG tablet Take 800 mg by mouth every 6 (six) hours as needed. For pain     Historical Provider, MD  ibuprofen (ADVIL,MOTRIN) 800 MG tablet Take 1 tablet (800 mg total) by mouth 3 (three) times daily. 05/02/13   Elson Areas, PA-C  ibuprofen (ADVIL,MOTRIN) 800 MG tablet Take 1 tablet (800 mg total) by mouth 3 (three) times daily. 05/23/13   April K Palumbo-Rasch, MD  mometasone (NASONEX) 50 MCG/ACT nasal spray Place 2 sprays into the nose daily. 05/23/13   April K Palumbo-Rasch, MD  Multiple Vitamin (MULITIVITAMIN WITH MINERALS) TABS Take 1 tablet by mouth daily.      Historical Provider, MD  penicillin v potassium (VEETID) 500 MG tablet Take 1 tablet (500  mg total) by mouth 3 (three) times daily. 06/22/13   Shari A Upstill, PA-C  vitamin B-12 (CYANOCOBALAMIN) 1000 MCG tablet Take 1,000 mcg by mouth daily.      Historical Provider, MD   Triage Vitals: BP 131/92  Pulse 89  Temp(Src) 98.7 F (37.1 C) (Oral)  Resp 18  Ht  (1.676 m)  Wt 190 lb (86.183 kg)  BMI 30.68 kg/m2  SpO2 99% Physical Exam  Nursing note and vitals reviewed. Constitutional: She is oriented to person, place, and time. She appears well-developed and well-nourished. No distress.  HENT:  Head: Normocephalic and atraumatic.  Eyes: Conjunctivae and EOM are normal.  Neck: Neck supple. No tracheal deviation present.  Cardiovascular: Normal rate.    Pulmonary/Chest: Effort normal. No respiratory distress.  Musculoskeletal: Normal range of motion.  Tenderness to palpation of the L upper chest and shoulder. There is pain with ROM of L shoulder. Ulnar and radial pulses are palpable. Sensation is intact in hand.  Neurological: She is alert and oriented to person, place, and time.  Skin: Skin is warm and dry.  Psychiatric: She has a normal mood and affect. Her behavior is normal.   ED Course  Procedures (including critical care time) DIAGNOSTIC STUDIES: Oxygen Saturation is 99% on RA, normal by my interpretation.    COORDINATION OF CARE: 9:09 PM-Discussed treatment plan which includes XR with pt at bedside and pt agreed to plan.   Labs Review Labs Reviewed - No data to display  Imaging Review Dg Chest 2 View  02/10/2014   CLINICAL DATA:  Left arm pain  EXAM: CHEST  2 VIEW  COMPARISON:  05/23/2013  FINDINGS: Lungs are clear.  No pleural effusion or pneumothorax.  The heart is normal in size.  Visualized osseous structures are within normal limits.  IMPRESSION: Normal chest radiographs.   Electronically Signed   By: Charline Bills M.D.   On: 02/10/2014 21:46    EKG Interpretation   Date/Time:  Monday February 10 2014 21:24:12 EDT Ventricular Rate:  85 PR Interval:  142 QRS Duration: 88 QT Interval:  394 QTC Calculation: 468 R Axis:   46 Text Interpretation:  Normal sinus rhythm Nonspecific T wave abnormality  Prolonged QT Abnormal ECG Confirmed by DELOS  MD, Lizvette Lightsey ([id number]) on  02/10/2014 10:46:08 PM      MDM   Final diagnoses:  None    Patient presents with complaints of intermittent sharp, stabbing pains in her left upper chest and arm. These have been going on for approximately one week and has been worsening. She denies any injury or trauma. She denies any difficulty breathing. Her pain appears very musculoskeletal on exam and EKG and chest x-ray are unremarkable. She was given Toradol with some relief. At this  point I feel as though she is appropriate for discharge. She will be given pain medication and advised to take anti-inflammatories. She is to return if her symptoms worsen.  I personally performed the services described in this documentation, which was scribed in my presence. The recorded information has been reviewed and is accurate.    Geoffery Lyons, MD 02/10/14 [phone]

## 2014-02-12 ENCOUNTER — Encounter (HOSPITAL_BASED_OUTPATIENT_CLINIC_OR_DEPARTMENT_OTHER): Payer: Self-pay | Admitting: Emergency Medicine

## 2014-02-12 ENCOUNTER — Emergency Department (HOSPITAL_BASED_OUTPATIENT_CLINIC_OR_DEPARTMENT_OTHER)
Admission: EM | Admit: 2014-02-12 | Discharge: 2014-02-12 | Discharge status: Against Medical Advice | Payer: Medicaid Other | Attending: Emergency Medicine | Admitting: Emergency Medicine

## 2014-02-12 DIAGNOSIS — R079 Chest pain, unspecified: Secondary | ICD-10-CM | POA: Diagnosis not present

## 2014-02-12 NOTE — ED Notes (Signed)
Pt seen here 3 days ago for same DX chest wall pain Pt states dilaudid tabs no relief

## 2014-02-12 NOTE — ED Notes (Signed)
Pt called x 3 no answer [facility] and [facility] checked , pt not found

## 2014-02-13 ENCOUNTER — Emergency Department (HOSPITAL_BASED_OUTPATIENT_CLINIC_OR_DEPARTMENT_OTHER)
Admission: EM | Admit: 2014-02-13 | Discharge: 2014-02-13 | Disposition: A | Discharge status: Home / Self Care | Payer: Medicaid Other | Attending: Emergency Medicine | Admitting: Emergency Medicine

## 2014-02-13 ENCOUNTER — Encounter (HOSPITAL_BASED_OUTPATIENT_CLINIC_OR_DEPARTMENT_OTHER): Payer: Self-pay | Admitting: Emergency Medicine

## 2014-02-13 DIAGNOSIS — Z79899 Other long term (current) drug therapy: Secondary | ICD-10-CM | POA: Diagnosis not present

## 2014-02-13 DIAGNOSIS — Z791 Long term (current) use of non-steroidal anti-inflammatories (NSAID): Secondary | ICD-10-CM | POA: Insufficient documentation

## 2014-02-13 DIAGNOSIS — M719 Bursopathy, unspecified: Principal | ICD-10-CM | POA: Insufficient documentation

## 2014-02-13 DIAGNOSIS — M67919 Unspecified disorder of synovium and tendon, unspecified shoulder: Secondary | ICD-10-CM | POA: Diagnosis not present

## 2014-02-13 DIAGNOSIS — Z792 Long term (current) use of antibiotics: Secondary | ICD-10-CM | POA: Diagnosis not present

## 2014-02-13 DIAGNOSIS — M79609 Pain in unspecified limb: Secondary | ICD-10-CM | POA: Insufficient documentation

## 2014-02-13 DIAGNOSIS — G43909 Migraine, unspecified, not intractable, without status migrainosus: Secondary | ICD-10-CM | POA: Insufficient documentation

## 2014-02-13 DIAGNOSIS — Z8742 Personal history of other diseases of the female genital tract: Secondary | ICD-10-CM | POA: Insufficient documentation

## 2014-02-13 DIAGNOSIS — M7552 Bursitis of left shoulder: Secondary | ICD-10-CM

## 2014-02-13 MED ORDER — KETOROLAC TROMETHAMINE 60 MG/2ML IM SOLN
60.0000 mg | Freq: Once | INTRAMUSCULAR | Status: AC
  Administered 2014-02-13: 60 mg via INTRAMUSCULAR
  Filled 2014-02-13: qty 2

## 2014-02-13 MED ORDER — METHYLPREDNISOLONE SODIUM SUCC 40 MG IJ SOLR
40.0000 mg | Freq: Once | INTRAMUSCULAR | Status: AC
  Administered 2014-02-13: 40 mg via INTRAMUSCULAR

## 2014-02-13 MED ORDER — OXYCODONE-ACETAMINOPHEN 7.5-325 MG PO TABS: 1.0000 | ORAL_TABLET | ORAL | Status: DC | PRN

## 2014-02-13 MED ORDER — BUPIVACAINE HCL 0.5 % IJ SOLN
INTRAMUSCULAR | Status: AC
  Administered 2014-02-13: 10:00:00 50 mL
  Filled 2014-02-13: qty 1

## 2014-02-13 MED ORDER — KETOROLAC TROMETHAMINE 30 MG/ML IJ SOLN: 60.0000 mg | Freq: Once | INTRAMUSCULAR | Status: DC

## 2014-02-13 MED ORDER — KETOROLAC TROMETHAMINE 60 MG/2ML IM SOLN
INTRAMUSCULAR | Status: AC
  Filled 2014-02-13: qty 2

## 2014-02-13 MED ORDER — BUPIVACAINE HCL 0.5 % IJ SOLN
50.0000 mL | Freq: Once | INTRAMUSCULAR | Status: AC
Start: 2014-02-13 — End: 2014-02-13
  Administered 2014-02-13: 50 mL

## 2014-02-13 MED ORDER — METHYLPREDNISOLONE SODIUM SUCC 40 MG IJ SOLR
INTRAMUSCULAR | Status: AC
  Administered 2014-02-13: 10:00:00 40 mg via INTRAMUSCULAR
  Filled 2014-02-13: qty 1

## 2014-02-13 NOTE — Discharge Instructions (Signed)
Bursitis Bursitis is inflammation of a bursa. A bursa is a soft, fluid-filled sac. It cushions the soft tissue around a bone. Bursitis often occurs in the bursas near the shoulders, elbows, knees, pelvis, hips, heel, and Achilles tendon.  SYMPTOMS   Pain and tenderness in the affected area. Sometimes, pain radiates into surrounding areas. Specifically, pain with movement.  Limited range of motion of the affected joint.  Sometimes, painless swelling of the bursa.  Fever (when infected). CAUSES   Injury to a joint or bursa.  Overuse or strenuous exercise of a joint.  Gout (disease with inflamed joints).  Prolonged pressure on a joint containing bursas (resting on an elbow or kneeling).  Arthritis.  Acute or chronic infection.  Calcium deposits in shoulder tendons, with degeneration of the tendon. RISK INCREASES WITH:  Vigorous, repeated, or sudden increase in athletic training or activity level.  Failure to warm up properly.  Overstretching.  Improper exercise technique.  Playing sports on [place]. PREVENTION  Avoid injuries or overuse of muscles.  Warm up and cool down properly. Do this before and after physical activity.  Maintain proper conditioning:  Joint flexibility.  Muscle strength and endurance.  Cardiovascular fitness.  Learn and use proper technique.  Wear protective equipment. PROGNOSIS  With proper treatment, symptoms often go away within 7 to 14 days.  RELATED COMPLICATIONS   Frequent recurrence of symptoms. This can result in a chronic, repetitive problem.  Joint stiffness.  Limited joint movement.  Infection of bursa.  Chronic inflammation or scarring of bursa. TREATMENT Treatment first involves protecting and resting the bursa and its joint. You may use ice or an elastic bandage to reduce inflammation. Anti-inflammatory medicines may help resolve the swelling. If symptoms persist despite treatment, a caregiver may withdraw fluid  from the bursa. They might also consider a corticosteroid injection. Sometimes, bursitis will persist in spite of nonsurgical treatment or will become infected. These cases may require removal (surgical excision) of the bursa.  MEDICATION   If pain medicine is needed, nonsteroidal anti-inflammatory medicines, such as aspirin and ibuprofen, or other minor pain relievers, such as acetaminophen, are often recommended.  Do not take pain medicine for 7 days before surgery.  Prescription pain relievers are usually only prescribed after surgery. Use only as directed and only as much as you need.  Ointments applied to the skin may be helpful.  Corticosteroid injections may be given. This is done to reduce inflammation in the bursa. HEAT AND COLD:  Cold treatment (icing) relieves pain and reduces inflammation. Cold treatment should be applied for 10 to 15 minutes every 2 to 3 hours for inflammation and pain, and immediately after any activity that aggravates your symptoms. Use ice packs or an ice massage.  Heat treatment may be used prior to performing the stretching and strengthening activities prescribed by your caregiver, physical therapist, or athletic trainer. Use a heat pack or a warm soak. SEEK MEDICAL CARE IF:   Symptoms get worse or do not improve in 2 weeks, despite treatment.  New, unexplained symptoms develop. (Drugs used in treatment may produce side effects.) Document Released: 05/09/2005 Document Revised: 09/23/2013 Document Reviewed: 08/21/2008 [facility] Patient Information 2015 [place], [place]. This information is not intended to replace advice given to you by your health care provider. Make sure you discuss any questions you have with your health care provider.  Shoulder Exercises EXERCISES  RANGE OF MOTION (ROM) AND STRETCHING EXERCISES These exercises may help you when beginning to rehabilitate your injury. Your symptoms may  resolve with or without further involvement from your  physician, physical therapist or athletic trainer. While completing these exercises, remember:   Restoring tissue flexibility helps normal motion to return to the joints. This allows healthier, less painful movement and activity.  An effective stretch should be held for at least 30 seconds.  A stretch should never be painful. You should only feel a gentle lengthening or release in the stretched tissue. ROM - Pendulum  Bend at the waist so that your right / left arm falls away from your body. Support yourself with your opposite hand on a solid surface, such as a table or a countertop.  Your right / left arm should be perpendicular to the ground. If it is not perpendicular, you need to lean over farther. Relax the muscles in your right / left arm and shoulder as much as possible.  Gently sway your hips and trunk so they move your right / left arm without any use of your right / left shoulder muscles.  Progress your movements so that your right / left arm moves side to side, then forward and backward, and finally, both clockwise and counterclockwise.  Complete __________ repetitions in each direction. Many people use this exercise to relieve discomfort in their shoulder as well as to gain range of motion. Repeat __________ times. Complete this exercise __________ times per day. STRETCH - Flexion, Standing  Stand with good posture. With an underhand grip on your right / left hand and an overhand grip on the opposite hand, grasp a broomstick or cane so that your hands are a little more than shoulder-width apart.  Keeping your right / left elbow straight and shoulder muscles relaxed, push the stick with your opposite hand to raise your right / left arm in front of your body and then overhead. Raise your arm until you feel a stretch in your right / left shoulder, but before you have increased shoulder pain.  Try to avoid shrugging your right / left shoulder as your arm rises by keeping your  shoulder blade tucked down and toward your mid-back spine. Hold __________ seconds.  Slowly return to the starting position. Repeat __________ times. Complete this exercise __________ times per day. STRETCH - Internal Rotation  Place your right / left hand behind your back, palm-up.  Throw a towel or belt over your opposite shoulder. Grasp the towel/belt with your right / left hand.  While keeping an upright posture, gently pull up on the towel/belt until you feel a stretch in the front of your right / left shoulder.  Avoid shrugging your right / left shoulder as your arm rises by keeping your shoulder blade tucked down and toward your mid-back spine.  Hold __________. Release the stretch by lowering your opposite hand. Repeat __________ times. Complete this exercise __________ times per day. STRETCH - External Rotation and Abduction  Stagger your stance through a doorframe. It does not matter which foot is forward.  As instructed by your physician, physical therapist or athletic trainer, place your hands:  And forearms above your head and on the door frame.  And forearms at head-height and on the door frame.  At elbow-height and on the door frame.  Keeping your head and chest upright and your stomach muscles tight to prevent over-extending your low-back, slowly shift your weight onto your front foot until you feel a stretch across your chest and/or in the front of your shoulders.  Hold __________ seconds. Shift your weight to your back foot to  release the stretch. Repeat __________ times. Complete this stretch __________ times per day.  STRENGTHENING EXERCISES  These exercises may help you when beginning to rehabilitate your injury. They may resolve your symptoms with or without further involvement from your physician, physical therapist or athletic trainer. While completing these exercises, remember:   Muscles can gain both the endurance and the strength needed for everyday  activities through controlled exercises.  Complete these exercises as instructed by your physician, physical therapist or athletic trainer. Progress the resistance and repetitions only as guided.  You may experience muscle soreness or fatigue, but the pain or discomfort you are trying to eliminate should never worsen during these exercises. If this pain does worsen, stop and make certain you are following the directions exactly. If the pain is still present after adjustments, discontinue the exercise until you can discuss the trouble with your clinician.  If advised by your physician, during your recovery, avoid activity or exercises which involve actions that place your right / left hand or elbow above your head or behind your back or head. These positions stress the tissues which are trying to heal. STRENGTH - Scapular Depression and Adduction  With good posture, sit on a firm chair. Supported your arms in front of you with pillows, arm rests or a table top. Have your elbows in line with the sides of your body.  Gently draw your shoulder blades down and toward your mid-back spine. Gradually increase the tension without tensing the muscles along the top of your shoulders and the back of your neck.  Hold for __________ seconds. Slowly release the tension and relax your muscles completely before completing the next repetition.  After you have practiced this exercise, remove the arm support and complete it in standing as well as sitting. Repeat __________ times. Complete this exercise __________ times per day.  STRENGTH - External Rotators  Secure a rubber exercise band/tubing to a fixed object so that it is at the same height as your right / left elbow when you are standing or sitting on a firm surface.  Stand or sit so that the secured exercise band/tubing is at your side that is not injured.  Bend your elbow 90 degrees. Place a folded towel or small pillow under your right / left arm so that  your elbow is a few inches away from your side.  Keeping the tension on the exercise band/tubing, pull it away from your body, as if pivoting on your elbow. Be sure to keep your body steady so that the movement is only coming from your shoulder rotating.  Hold __________ seconds. Release the tension in a controlled manner as you return to the starting position. Repeat __________ times. Complete this exercise __________ times per day.  STRENGTH - Supraspinatus  Stand or sit with good posture. Grasp a __________ weight or an exercise band/tubing so that your hand is "thumbs-up," like when you shake hands.  Slowly lift your right / left hand from your thigh into the air, traveling about 30 degrees from straight out at your side. Lift your hand to shoulder height or as far as you can without increasing any shoulder pain. Initially, many people do not lift their hands above shoulder height.  Avoid shrugging your right / left shoulder as your arm rises by keeping your shoulder blade tucked down and toward your mid-back spine.  Hold for __________ seconds. Control the descent of your hand as you slowly return to your starting position. Repeat __________ times.  Complete this exercise __________ times per day.  STRENGTH - Shoulder Extensors  Secure a rubber exercise band/tubing so that it is at the height of your shoulders when you are either standing or sitting on a firm arm-less chair.  With a thumbs-up grip, grasp an end of the band/tubing in each hand. Straighten your elbows and lift your hands straight in front of you at shoulder height. Step back away from the secured end of band/tubing until it becomes tense.  Squeezing your shoulder blades together, pull your hands down to the sides of your thighs. Do not allow your hands to go behind you.  Hold for __________ seconds. Slowly ease the tension on the band/tubing as you reverse the directions and return to the starting position. Repeat  __________ times. Complete this exercise __________ times per day.  STRENGTH - Scapular Retractors  Secure a rubber exercise band/tubing so that it is at the height of your shoulders when you are either standing or sitting on a firm arm-less chair.  With a palm-down grip, grasp an end of the band/tubing in each hand. Straighten your elbows and lift your hands straight in front of you at shoulder height. Step back away from the secured end of band/tubing until it becomes tense.  Squeezing your shoulder blades together, draw your elbows back as you bend them. Keep your upper arm lifted away from your body throughout the exercise.  Hold __________ seconds. Slowly ease the tension on the band/tubing as you reverse the directions and return to the starting position. Repeat __________ times. Complete this exercise __________ times per day. STRENGTH - Scapular Depressors  Find a sturdy chair without wheels, such as a from a dining room table.  Keeping your feet on the floor, lift your bottom from the seat and lock your elbows.  Keeping your elbows straight, allow gravity to pull your body weight down. Your shoulders will rise toward your ears.  Raise your body against gravity by drawing your shoulder blades down your back, shortening the distance between your shoulders and ears. Although your feet should always maintain contact with the floor, your feet should progressively support less body weight as you get stronger.  Hold __________ seconds. In a controlled and slow manner, lower your body weight to begin the next repetition. Repeat __________ times. Complete this exercise __________ times per day.  Document Released: 03/23/2005 Document Revised: 08/01/2011 Document Reviewed: 08/21/2008 [facility] Patient Information 2015 [place], [place]. This information is not intended to replace advice given to you by your health care provider. Make sure you discuss any questions you have with your health care  provider.

## 2014-02-13 NOTE — ED Provider Notes (Signed)
CSN: [id number]     Arrival date & time 02/13/14  0830 History   First MD Initiated Contact with Patient 02/13/14 0901     Chief Complaint  Patient presents with  . Arm Pain      HPI Patient presents with chief complaint of left shoulder and left arm pain.  This is been progressively worsening over the last several days.  She was seen here recently and evaluated with chest pain but she says the pain is now localized in her left shoulder area is very intense when she has any movement of her shoulder.  She says this feels similar to an episode of bursitis she had in her elbow in the past.  She's been taking ibuprofen and hydrocodone his head minimal to no relief.  She denies any fever or chills.  Denies chest pain.  Her pain is exacerbated in reproduced by any minor movement of her left shoulder either abduction or abduction. Past Medical History  Diagnosis Date  . Migraine   . Endometriosis    Past Surgical History  Procedure Laterality Date  . Abdominal hysterectomy    . Cesarean section    . Hernia repair    . Laparoscopy     No family history on file. History  Substance Use Topics  . Smoking status: Never Smoker   . Smokeless tobacco: Not on file  . Alcohol Use: No   OB History   Grav Para Term Preterm Abortions TAB SAB Ect Mult Living                 Review of Systems  All other systems reviewed and are negative  Allergies  Review of patient's allergies indicates no known allergies.  Home Medications   Prior to Admission medications   Medication Sig Start Date End Date Taking? Authorizing Provider  cephALEXin (KEFLEX) 500 MG capsule Take 1 capsule (500 mg total) by mouth 4 (four) times daily. 02/25/13   Candyce Churn III, MD  diphenhydrAMINE (BENADRYL) 25 MG tablet Take 25 mg by mouth at bedtime.      Historical Provider, MD  HYDROcodone-acetaminophen (NORCO) 5-325 MG per tablet Take 1-2 tablets by mouth every 4 (four) hours as needed. 02/10/14   Geoffery Lyons, MD   HYDROcodone-acetaminophen (NORCO/VICODIN) 5-325 MG per tablet Take 2 tablets by mouth every 4 (four) hours as needed. 05/02/13   Elson Areas, PA-C  HYDROcodone-acetaminophen (NORCO/VICODIN) 5-325 MG per tablet Take 1-2 tablets by mouth every 4 (four) hours as needed for moderate pain. 06/22/13   Shari A Upstill, PA-C  ibuprofen (ADVIL,MOTRIN) 200 MG tablet Take 800 mg by mouth every 6 (six) hours as needed. For pain     Historical Provider, MD  ibuprofen (ADVIL,MOTRIN) 800 MG tablet Take 1 tablet (800 mg total) by mouth 3 (three) times daily. 05/02/13   Elson Areas, PA-C  ibuprofen (ADVIL,MOTRIN) 800 MG tablet Take 1 tablet (800 mg total) by mouth 3 (three) times daily. 05/23/13   April K Palumbo-Rasch, MD  mometasone (NASONEX) 50 MCG/ACT nasal spray Place 2 sprays into the nose daily. 05/23/13   April K Palumbo-Rasch, MD  Multiple Vitamin (MULITIVITAMIN WITH MINERALS) TABS Take 1 tablet by mouth daily.      Historical Provider, MD  oxyCODONE-acetaminophen (PERCOCET) 7.5-325 MG per tablet Take 1 tablet by mouth every 4 (four) hours as needed for pain. 02/13/14   Nelia Shi, MD  penicillin v potassium (VEETID) 500 MG tablet Take 1 tablet (500 mg total) by  mouth 3 (three) times daily. 06/22/13   Shari A Upstill, PA-C  vitamin B-12 (CYANOCOBALAMIN) 1000 MCG tablet Take 1,000 mcg by mouth daily.      Historical Provider, MD   BP 136/79  Pulse 83  Temp(Src) 97.8 F (36.6 C) (Oral)  Resp 18  SpO2 95% Physical Exam Physical Exam  Nursing note and vitals reviewed. Constitutional: She is oriented to person, place, and time. She appears well-developed and well-nourished. No distress.  HENT:  Head: Normocephalic and atraumatic.  Eyes: Pupils are equal, round, and reactive to light.  Neck: Normal range of motion.  Cardiovascular: Normal rate and intact distal pulses.   Pulmonary/Chest: No respiratory distress.  Abdominal: Normal appearance. She exhibits no distension.  Musculoskeletal: Limited  range of motion to left shoulder with reproduction of pain with any abduction or abduction of the shoulder.  The acromial bursa appears extremely tender and reproduces her pain.  Patient has good radial and ulnar pulses in her left wrist. Neurological: She is alert and oriented to person, place, and time. No cranial nerve deficit.  Skin: Skin is warm and dry. No rash noted.  Psychiatric: She has a normal mood and affect. Her behavior is normal.   ED Course  Injection of joint Date/Time: 02/13/2014 9:33 AM Performed by: Nelia Shi Authorized by: Nelia Shi Consent: Verbal consent obtained. written consent not obtained. Consent given by: patient Patient understanding: patient states understanding of the procedure being performed Patient consent: the patient's understanding of the procedure matches consent given Procedure consent: procedure consent matches procedure scheduled Required items: required blood products, implants, devices, and special equipment available Patient identity confirmed: verbally with patient and arm band Time out: Immediately prior to procedure a "time out" was called to verify the correct patient, procedure, equipment, support staff and site/side marked as required. Preparation: Patient was prepped and draped in the usual sterile fashion. Local anesthesia used: no Patient sedated: no Patient tolerance: Patient tolerated the procedure well with no immediate complications. Comments: 2 cc of bupivacaine 40 mg of Solu-Medrol   Medications  methylPREDNISolone sodium succinate (SOLU-MEDROL) 40 mg/mL injection 40 mg (40 mg Intramuscular Given 02/13/14 0940)  bupivacaine (MARCAINE) 0.5 % (with pres) injection 50 mL (50 mLs Infiltration Given 02/13/14 0941)  ketorolac (TORADOL) injection 60 mg (60 mg Intramuscular Given 02/13/14 0940)      Labs Review Labs Reviewed - No data to display  Imaging Review No results found.  A acromial bursa injection of  Solu-Medrol and bupivacaine was done in the emergency room.  Plan at this time is to treat with anti-inflammatories and pain medicine.  If no improvement in 24-48 hours she was referred to sports medicine clinic for physical therapy and possible second injection.  MDM   Final diagnoses:  Bursitis of left shoulder        Nelia Shi, MD 02/13/14 [phone]

## 2014-02-13 NOTE — ED Notes (Addendum)
Pt sts she was seen here a few days a go for chest/left arm pain and the pain has not improved. She also sts that the vicodin is not helping her pain. Was here last night but left before being seen by EDP.

## 2014-02-13 NOTE — ED Notes (Signed)
MD at bedside. 

## 2014-11-27 ENCOUNTER — Emergency Department (HOSPITAL_BASED_OUTPATIENT_CLINIC_OR_DEPARTMENT_OTHER): Payer: Medicaid Other

## 2014-11-27 ENCOUNTER — Inpatient Hospital Stay (HOSPITAL_BASED_OUTPATIENT_CLINIC_OR_DEPARTMENT_OTHER)
Admission: EM | Admit: 2014-11-27 | Discharge: 2014-11-29 | DRG: 175 | Disposition: A | Discharge status: Home / Self Care | Payer: Medicaid Other | Attending: Internal Medicine | Admitting: Internal Medicine

## 2014-11-27 ENCOUNTER — Encounter (HOSPITAL_BASED_OUTPATIENT_CLINIC_OR_DEPARTMENT_OTHER): Payer: Self-pay

## 2014-11-27 DIAGNOSIS — J9601 Acute respiratory failure with hypoxia: Secondary | ICD-10-CM | POA: Diagnosis present

## 2014-11-27 DIAGNOSIS — R609 Edema, unspecified: Secondary | ICD-10-CM

## 2014-11-27 DIAGNOSIS — R0600 Dyspnea, unspecified: Secondary | ICD-10-CM | POA: Diagnosis not present

## 2014-11-27 DIAGNOSIS — R0602 Shortness of breath: Secondary | ICD-10-CM | POA: Diagnosis not present

## 2014-11-27 DIAGNOSIS — Z90710 Acquired absence of both cervix and uterus: Secondary | ICD-10-CM | POA: Diagnosis not present

## 2014-11-27 DIAGNOSIS — G43909 Migraine, unspecified, not intractable, without status migrainosus: Secondary | ICD-10-CM | POA: Diagnosis present

## 2014-11-27 DIAGNOSIS — I2699 Other pulmonary embolism without acute cor pulmonale: Secondary | ICD-10-CM | POA: Diagnosis present

## 2014-11-27 DIAGNOSIS — N809 Endometriosis, unspecified: Secondary | ICD-10-CM | POA: Diagnosis present

## 2014-11-27 LAB — CBC WITH DIFFERENTIAL/PLATELET
BASOS ABS: 0 10*3/uL (ref 0.0–0.1)
Basophils Relative: 0 % (ref 0–1)
Eosinophils Absolute: 0.2 10*3/uL (ref 0.0–0.7)
Eosinophils Relative: 2 % (ref 0–5)
HCT: 37.3 % (ref 36.0–46.0)
Hemoglobin: 12.3 g/dL (ref 12.0–15.0)
LYMPHS ABS: 3.5 10*3/uL (ref 0.7–4.0)
LYMPHS PCT: 38 % (ref 12–46)
MCH: 28.5 pg (ref 26.0–34.0)
MCHC: 33 g/dL (ref 30.0–36.0)
MCV: 86.5 fL (ref 78.0–100.0)
Monocytes Absolute: 0.7 10*3/uL (ref 0.1–1.0)
Monocytes Relative: 8 % (ref 3–12)
NEUTROS ABS: 4.7 10*3/uL (ref 1.7–7.7)
NEUTROS PCT: 52 % (ref 43–77)
PLATELETS: 282 10*3/uL (ref 150–400)
RBC: 4.31 MIL/uL (ref 3.87–5.11)
RDW: 15 % (ref 11.5–15.5)
WBC: 9 10*3/uL (ref 4.0–10.5)

## 2014-11-27 LAB — BASIC METABOLIC PANEL
Anion gap: 7 (ref 5–15)
BUN: 16 mg/dL (ref 6–20)
CALCIUM: 8.9 mg/dL (ref 8.9–10.3)
CO2: 22 mmol/L (ref 22–32)
CREATININE: 0.84 mg/dL (ref 0.44–1.00)
Chloride: 112 mmol/L — ABNORMAL HIGH (ref 101–111)
GFR calc Af Amer: >60 mL/min (ref >60)
GFR calc non Af Amer: >60 mL/min (ref >60)
GLUCOSE: 122 mg/dL — AB (ref 65–99)
Potassium: 3.6 mmol/L (ref 3.5–5.1)
SODIUM: 141 mmol/L (ref 135–145)

## 2014-11-27 LAB — D-DIMER, QUANTITATIVE (NOT AT ARMC): D DIMER QUANT: 1.57 ug{FEU}/mL — AB (ref 0.00–0.48)

## 2014-11-27 LAB — TROPONIN I: Troponin I: <0.03 ng/mL (ref <0.031)

## 2014-11-27 MED ORDER — ONDANSETRON HCL 4 MG/2ML IJ SOLN
4.0000 mg | Freq: Once | INTRAMUSCULAR | Status: AC
  Administered 2014-11-27: 4 mg via INTRAVENOUS

## 2014-11-27 MED ORDER — HEPARIN (PORCINE) IN NACL 100-0.45 UNIT/ML-% IJ SOLN
1400.0000 [IU]/h | INTRAMUSCULAR | Status: DC
  Administered 2014-11-27: 1400 [IU]/h via INTRAVENOUS
  Filled 2014-11-27 (×2): qty 250

## 2014-11-27 MED ORDER — HEPARIN BOLUS VIA INFUSION
5000.0000 [IU] | Freq: Once | INTRAVENOUS | Status: AC
  Administered 2014-11-27: 5000 [IU] via INTRAVENOUS

## 2014-11-27 MED ORDER — ONDANSETRON HCL 4 MG/2ML IJ SOLN
INTRAMUSCULAR | Status: AC
  Administered 2014-11-27: 4 mg via INTRAVENOUS
  Filled 2014-11-27: qty 2

## 2014-11-27 MED ORDER — IOHEXOL 350 MG/ML SOLN
100.0000 mL | Freq: Once | INTRAVENOUS | Status: AC | PRN
  Administered 2014-11-27: 100 mL via INTRAVENOUS

## 2014-11-27 MED ORDER — SODIUM CHLORIDE 0.9 % IV BOLUS (SEPSIS)
1000.0000 mL | Freq: Once | INTRAVENOUS | Status: AC
  Administered 2014-11-27: 1000 mL via INTRAVENOUS

## 2014-11-27 NOTE — ED Notes (Signed)
MD at bedside. 

## 2014-11-27 NOTE — ED Notes (Addendum)
C/o SOB, fatigue, swelling to ankles and nausea x 1 week-denies pain at present-intermittent CP after eating over the last week-A/O-steady gait-NAD

## 2014-11-27 NOTE — Progress Notes (Signed)
44 yo with shortness of breath. Patient had a submassive PE on CT scan. Patient has had symptoms for at least a month with dizziness and tachypnea. Patient will be admitted to step down. [facility]  Freda MunroSaadat Christyne Mccain MD

## 2014-11-27 NOTE — ED Notes (Addendum)
Pt became dizzy when stood from triage chair and needed to sit back down-states dizziness has also been intermittent x 1 week-concerned her s/s could be r/t rx topamax-taken to tx room via mobile stretcher chair

## 2014-11-27 NOTE — ED Provider Notes (Signed)
CSN: [id number]     Arrival date & time 11/27/14  [id number] History  This chart was scribed for Pricilla LovelessScott Remijio Holleran, MD by Murriel HopperAlec Bankhead, ED Scribe. This patient was seen in room MH04/MH04 and the patient's care was started at 8:28 PM.    Chief Complaint  Patient presents with  . Shortness of Breath      The history is provided by the patient. No language interpreter was used.     HPI Comments: Rick Duffarisienne Busby is a 44 y.o. female who presents to the Emergency Department complaining of intermittent, worsening SOB with associated dizziness and nausea that worsens with exertion that has been present for a few months. Pt states that she is concerned about her symptoms the past couple of weeks because going up and down the stairs of her apartment complex causes near-syncopal episodes. Pt also notes that walking short-distances on flat ground have caused her to have SOB and become dizzy and nauseated as well. Pt states that today she was walking down the aisle of a store and became short of breath, which concerned her enough to visit ED. Pt notes that she is currently taking Topamax and thinks that some of her symptoms could be related to that prescription. Pt also reports vomiting two days ago and a few episodes of bloody stools over the past couple of weeks. Pt denies fever and cough.      Past Medical History  Diagnosis Date  . Migraine   . Endometriosis    Past Surgical History  Procedure Laterality Date  . Abdominal hysterectomy    . Cesarean section    . Hernia repair    . Laparoscopy     No family history on file. History  Substance Use Topics  . Smoking status: Never Smoker   . Smokeless tobacco: Not on file  . Alcohol Use: No   OB History    No data available     Review of Systems  Constitutional: Negative for fever.  Respiratory: Positive for shortness of breath. Negative for cough.   Gastrointestinal: Positive for nausea and vomiting.  Neurological: Positive for dizziness.   All other systems reviewed and are negative.     Allergies  Review of patient's allergies indicates no known allergies.  Home Medications   Prior to Admission medications   Medication Sig Start Date End Date Taking? Authorizing Provider  topiramate (TOPAMAX) 100 MG tablet Take 100 mg by mouth daily.   Yes Historical Provider, MD  cephALEXin (KEFLEX) 500 MG capsule Take 1 capsule (500 mg total) by mouth 4 (four) times daily. 02/25/13   Blake DivineJohn Wofford, MD  diphenhydrAMINE (BENADRYL) 25 MG tablet Take 25 mg by mouth at bedtime.      Historical Provider, MD  Multiple Vitamin (MULITIVITAMIN WITH MINERALS) TABS Take 1 tablet by mouth daily.      Historical Provider, MD   BP 122/74 mmHg  Pulse 86  Temp(Src) 98.3 F (36.8 C) (Oral)  Resp 32  Ht 5\' 6"  (1.676 m)  Wt 200 lb (90.719 kg)  BMI 32.30 kg/m2  SpO2 100% Physical Exam  Constitutional: She is oriented to person, place, and time. She appears well-developed and well-nourished.  HENT:  Head: Normocephalic and atraumatic.  Right Ear: External ear normal.  Left Ear: External ear normal.  Nose: Nose normal.  Eyes: Right eye exhibits no discharge. Left eye exhibits no discharge.  Cardiovascular: Normal rate, regular rhythm and normal heart sounds.   Pulmonary/Chest: Effort normal and breath sounds normal. Tachypnea  noted.  Abdominal: Soft. There is no tenderness.  Neurological: She is alert and oriented to person, place, and time.  Skin: Skin is warm and dry.  Nursing note and vitals reviewed.   ED Course  Procedures (including critical care time)  DIAGNOSTIC STUDIES: Oxygen Saturation is 100% on room air, normal by my interpretation.    COORDINATION OF CARE: 8:33 PM Discussed treatment plan with pt at bedside and pt agreed to plan.   Labs Review Labs Reviewed  BASIC METABOLIC PANEL - Abnormal; Notable for the following:    Chloride 112 (*)    Glucose, Bld 122 (*)    All other components within normal limits  D-DIMER,  QUANTITATIVE (NOT AT [facility]) - Abnormal; Notable for the following:    D-Dimer, Quant 1.57 (*)    All other components within normal limits  CBC WITH DIFFERENTIAL/PLATELET  TROPONIN I  HEPARIN LEVEL (UNFRACTIONATED)  CBC    Imaging Review Dg Chest 2 View  11/27/2014   CLINICAL DATA:  Dyspnea  EXAM: CHEST  2 VIEW  COMPARISON:  02/10/2014  FINDINGS: Normal heart size and mediastinal contours. Mild atelectasis at the left base. No acute infiltrate or edema. No effusion or pneumothorax. Slight dextro thoracic curvature of the upper thoracic spine. No acute osseous findings.  IMPRESSION: Minor atelectasis at the left base.   Electronically Signed   By: Marnee Spring M.D.   On: 11/27/2014 21:01   Ct Angio Chest Pe W/cm &/or Wo Cm  11/27/2014   CLINICAL DATA:  Shortness of Breath for 2 weeks  EXAM: CT ANGIOGRAPHY CHEST WITH CONTRAST  TECHNIQUE: Multidetector CT imaging of the chest was performed using the standard protocol during bolus administration of intravenous contrast. Multiplanar CT image reconstructions and MIPs were obtained to evaluate the vascular anatomy.  CONTRAST:  OMNIPAQUE IOHEXOL 350 MG/ML SOLN  COMPARISON:  Chest radiograph November 27, 2014  FINDINGS: There is extensive pulmonary embolism arising from the right main pulmonary artery extending into multiple right upper, middle, and lower lobe branches. There is pulmonary embolus on the left extending into multiple left lower lobe pulmonary arterial branches. The right ventricle to left ventricle ratio is 1.2, indicative of right heart strain.  There is no thoracic aortic aneurysm or dissection.  The lungs are clear. There is no appreciable thoracic adenopathy. There is a 5 mm nodular opacity in the right lobe of the thyroid. Thyroid otherwise appears unremarkable. The pericardium is not thickened.  In the visualized upper abdomen, there may be mild hepatic steatosis.  There are no blastic or lytic bone lesions.  Review of the MIP images  confirms the above findings.  IMPRESSION: Extensive pulmonary embolus. Positive for acute PE with CT evidence of right heart strain (RV/LV Ratio = 1.2) consistent with at least submassive (intermediate risk) PE. The presence of right heart strain has been associated with an increased risk of morbidity and mortality. Please activate Code PE by paging [phone].  No lung edema or consolidation. No adenopathy. Small nodular opacity in the right lobe of the thyroid. Evidence of hepatic steatosis.  Critical Value/emergent results were called by telephone at the time of interpretation on 11/27/2014 at 9:44 pm to Dr. Pricilla Loveless , who verbally acknowledged these results.   Electronically Signed   By: Bretta Bang III M.D.   On: 11/27/2014 21:46     EKG Interpretation   Date/Time:  Thursday November 27 2014 19:47:21 EDT Ventricular Rate:  80 PR Interval:  126 QRS Duration: 88 QT  Interval:  406 QTC Calculation: 468 R Axis:   50 Text Interpretation:  Normal sinus rhythm Nonspecific T wave abnormality  Prolonged QT Abnormal ECG no significant change since 2015 Confirmed by  Jaece Ducharme  MD, Gerrie Castiglia ([id number]) on 11/27/2014 8:21:38 PM      CRITICAL CARE Performed by: Pricilla Loveless T   Total critical care time: 40 minutes  Critical care time was exclusive of separately billable procedures and treating other patients.  Critical care was necessary to treat or prevent imminent or life-threatening deterioration.  Critical care was time spent personally by me on the following activities: development of treatment plan with patient and/or surrogate as well as nursing, discussions with consultants, evaluation of patient's response to treatment, examination of patient, obtaining history from patient or surrogate, ordering and performing treatments and interventions, ordering and review of laboratory studies, ordering and review of radiographic studies, pulse oximetry and re-evaluation of patient's condition.   MDM   Final diagnoses:  Pulmonary embolism    Patient's dyspnea appears to be related to a submassive pulmonary embolism. She has had dyspnea for several weeks, this could be subacute. She does have evidence of right heart strain on CT but no troponin elevation. She is not hypoxic, hypotensive, or any distress. She is tachypnea but is able to speak in full sentences. Given this she was started on IV heparin. Of note she has had intermittent blood in her stool, none today. Her hemoglobin is normal. I believe that the benefits of heparin outweigh the risks of possible bleeding. With no hypotension or distress and did not feel TPA is indicated. Discussed with ICU who recommends hospitalist admission, Dr. Welton Flakes to admit to stepdown.  I personally performed the services described in this documentation, which was scribed in my presence. The recorded information has been reviewed and is accurate.    Pricilla Loveless, MD 11/27/14 [phone]

## 2014-11-27 NOTE — Progress Notes (Signed)
ANTICOAGULATION CONSULT NOTE - Initial Consult  Pharmacy Consult for Heparin Indication: pulmonary embolus  No Known Allergies  Patient Measurements: Height: 5\' 6"  (167.6 cm) Weight: 200 lb (90.719 kg) IBW/kg (Calculated) : 59.3 Heparin Dosing Weight: 79 kg  Vital Signs: Temp: 98.3 F (36.8 C) (07/07 1945) Temp Source: Oral (07/07 1945) BP: 115/79 mmHg (07/07 2112) Pulse Rate: 83 (07/07 2112)  Labs:  Recent Labs  11/27/14 2000  HGB 12.3  HCT 37.3  PLT 282  CREATININE 0.84  TROPONINI <0.03    Estimated Creatinine Clearance: 97 mL/min (by C-G formula based on Cr of 0.84).   Medical History: Past Medical History  Diagnosis Date  . Migraine   . Endometriosis     Medications:   (Not in a hospital admission) Scheduled:  Infusions:   Assessment: 44yo female with history of migraines presents with SOB associated with dizziness and nausea. Pharmacy is consulted to dose heparin for PE. CTA chest reveals extensive PE with R heart strain consistent with at least submassive PE. CBC is wnl, sCr 0.8, Trop neg x1, D-dimer 1.6.  Goal of Therapy:  Heparin level 0.3-0.7 units/ml Monitor platelets by anticoagulation protocol: Yes   Plan:  Give 5000 units bolus x 1 Start heparin infusion at 1400 units/hr Check anti-Xa level in 6 hours and daily while on heparin Continue to monitor H&H and platelets  Arlean Hoppingorey M. Newman PiesBall, PharmD Clinical Pharmacist Pager [phone] 11/27/2014,9:49 PM

## 2014-11-27 NOTE — ED Notes (Signed)
Pt did have noted DOE after walking steps from reg to triage

## 2014-11-28 ENCOUNTER — Encounter (HOSPITAL_COMMUNITY): Payer: Self-pay

## 2014-11-28 ENCOUNTER — Ambulatory Visit (HOSPITAL_COMMUNITY): Payer: Medicaid Other

## 2014-11-28 DIAGNOSIS — R0600 Dyspnea, unspecified: Secondary | ICD-10-CM

## 2014-11-28 DIAGNOSIS — I2699 Other pulmonary embolism without acute cor pulmonale: Principal | ICD-10-CM

## 2014-11-28 DIAGNOSIS — R609 Edema, unspecified: Secondary | ICD-10-CM

## 2014-11-28 LAB — CBC
HCT: 36.8 % (ref 36.0–46.0)
Hemoglobin: 11.9 g/dL — ABNORMAL LOW (ref 12.0–15.0)
MCH: 28.5 pg (ref 26.0–34.0)
MCHC: 32.3 g/dL (ref 30.0–36.0)
MCV: 88 fL (ref 78.0–100.0)
Platelets: 277 10*3/uL (ref 150–400)
RBC: 4.18 MIL/uL (ref 3.87–5.11)
RDW: 15.4 % (ref 11.5–15.5)
WBC: 8.9 10*3/uL (ref 4.0–10.5)

## 2014-11-28 LAB — BASIC METABOLIC PANEL
ANION GAP: 5 (ref 5–15)
BUN: 10 mg/dL (ref 6–20)
CHLORIDE: 113 mmol/L — AB (ref 101–111)
CO2: 24 mmol/L (ref 22–32)
CREATININE: 0.75 mg/dL (ref 0.44–1.00)
Calcium: 8.4 mg/dL — ABNORMAL LOW (ref 8.9–10.3)
GFR calc Af Amer: >60 mL/min (ref >60)
GFR calc non Af Amer: >60 mL/min (ref >60)
GLUCOSE: 111 mg/dL — AB (ref 65–99)
Potassium: 3.8 mmol/L (ref 3.5–5.1)
Sodium: 142 mmol/L (ref 135–145)

## 2014-11-28 LAB — ANTITHROMBIN III: AntiThromb III Func: 97 % (ref 75–120)

## 2014-11-28 LAB — MRSA PCR SCREENING: MRSA BY PCR: NEGATIVE

## 2014-11-28 MED ORDER — ACETAMINOPHEN 650 MG RE SUPP: 650.0000 mg | Freq: Four times a day (QID) | RECTAL | Status: DC | PRN

## 2014-11-28 MED ORDER — GUAIFENESIN-DM 100-10 MG/5ML PO SYRP
10.0000 mL | ORAL_SOLUTION | Freq: Four times a day (QID) | ORAL | Status: DC | PRN
  Administered 2014-11-28 – 2014-11-29 (×2): 10 mL via ORAL
  Filled 2014-11-28 (×2): qty 10

## 2014-11-28 MED ORDER — HYDROMORPHONE HCL 1 MG/ML IJ SOLN: 1.0000 mg | INTRAMUSCULAR | Status: DC | PRN

## 2014-11-28 MED ORDER — ONDANSETRON HCL 4 MG/2ML IJ SOLN
4.0000 mg | Freq: Four times a day (QID) | INTRAMUSCULAR | Status: DC | PRN
  Filled 2014-11-28: qty 2

## 2014-11-28 MED ORDER — ONDANSETRON HCL 4 MG PO TABS: 4.0000 mg | ORAL_TABLET | Freq: Four times a day (QID) | ORAL | Status: DC | PRN

## 2014-11-28 MED ORDER — TOPIRAMATE 100 MG PO TABS
100.0000 mg | ORAL_TABLET | Freq: Every day | ORAL | Status: DC
  Administered 2014-11-28 – 2014-11-29 (×2): 100 mg via ORAL
  Filled 2014-11-28 (×2): qty 1

## 2014-11-28 MED ORDER — ALUM & MAG HYDROXIDE-SIMETH 200-200-20 MG/5ML PO SUSP: 30.0000 mL | Freq: Four times a day (QID) | ORAL | Status: DC | PRN

## 2014-11-28 MED ORDER — APIXABAN 5 MG PO TABS: 5.0000 mg | ORAL_TABLET | Freq: Two times a day (BID) | ORAL | Status: DC

## 2014-11-28 MED ORDER — ACETAMINOPHEN 325 MG PO TABS: 650.0000 mg | ORAL_TABLET | Freq: Four times a day (QID) | ORAL | Status: DC | PRN

## 2014-11-28 MED ORDER — RIVAROXABAN 20 MG PO TABS: 20.0000 mg | ORAL_TABLET | Freq: Every day | ORAL | Status: DC

## 2014-11-28 MED ORDER — APIXABAN 5 MG PO TABS
10.0000 mg | ORAL_TABLET | Freq: Two times a day (BID) | ORAL | Status: DC
  Administered 2014-11-28: 10 mg via ORAL
  Filled 2014-11-28 (×2): qty 2

## 2014-11-28 MED ORDER — RIVAROXABAN 15 MG PO TABS
15.0000 mg | ORAL_TABLET | Freq: Two times a day (BID) | ORAL | Status: DC
  Administered 2014-11-28 – 2014-11-29 (×2): 15 mg via ORAL
  Filled 2014-11-28 (×5): qty 1

## 2014-11-28 MED ORDER — ADULT MULTIVITAMIN W/MINERALS CH
1.0000 | ORAL_TABLET | Freq: Every day | ORAL | Status: DC
  Administered 2014-11-28 – 2014-11-29 (×2): 1 via ORAL
  Filled 2014-11-28 (×2): qty 1

## 2014-11-28 MED ORDER — HYDROCODONE-ACETAMINOPHEN 5-325 MG PO TABS
1.0000 | ORAL_TABLET | ORAL | Status: DC | PRN
  Administered 2014-11-28 – 2014-11-29 (×3): 1 via ORAL
  Filled 2014-11-28 (×3): qty 1

## 2014-11-28 NOTE — Progress Notes (Signed)
Patient Demographics:    Ellen Hood, is a 44 y.o. female, DOB - 1971/04/05, ZOX:[id number]  Admit date - 11/27/2014   Admitting Physician Yevonne PaxSaadat A Khan, MD  Outpatient Primary MD for the patient is No PCP Per Patient  LOS - 1   Chief Complaint  Patient presents with  . Shortness of Breath        Subjective:    Ellen Hood today has, No headache, No chest pain, No abdominal pain - No Nausea, No new weakness tingling or numbness, No Cough - SOB.     Assessment  & Plan :     1. Acute hypoxic respiratory failure due to submassive PE with evidence of right heart strain on CT angiogram - on heparin drip overnight, we will now transition to oral anticoagulation agent, her insurance covers xaralto so will request pharmacy to dose xaralto. Hypercoagulable panel, lower extremity ultrasound and echocardiogram pending. Now symptom-free stable discharge in the morning.     Code Status : Full  Family Communication  : None  Disposition Plan  : Home in am  Consults  :  None  Procedures  :   CTA - PE  Venous US Legs  DVT Prophylaxis  :  Eliquis/Xaralto  Lab Results  Component Value Date   PLT 277 11/28/2014    Inpatient Medications  Scheduled Meds: . apixaban  10 mg Oral BID   Followed by  . [START ON 12/05/2014] apixaban  5 mg Oral BID  . multivitamin with minerals  1 tablet Oral Daily  . topiramate  100 mg Oral Daily   Continuous Infusions:  PRN Meds:.acetaminophen **OR** acetaminophen, alum & mag hydroxide-simeth, HYDROcodone-acetaminophen, HYDROmorphone (DILAUDID) injection, ondansetron **OR** ondansetron (ZOFRAN) IV  Antibiotics  :    Anti-infectives    None        Objective:   Filed Vitals:   11/28/14 0500 11/28/14 0600 11/28/14 0804 11/28/14 1055  BP:   114/70  115/85  Pulse:   68 74  Temp: 97.6 F (36.4 C)  97.9 F (36.6 C) 97.5 F (36.4 C)  TempSrc: Oral  Oral Oral  Resp:   20   Height:      Weight:      SpO2:  100% 100% 100%    Wt Readings from Last 3 Encounters:  11/28/14 93.3 kg (205 lb 11 oz)  02/12/14 88.451 kg (195 lb)  02/10/14 86.183 kg (190 lb)     Intake/Output Summary (Last 24 hours) at 11/28/14 1102 Last data filed at 11/28/14 1021  Gross per 24 hour  Intake 406.13 ml  Output    825 ml  Net -418.87 ml     Physical Exam  Awake Alert, Oriented X 3, No new F.N deficits, Normal affect [place].AT,PERRAL Supple Neck,No JVD, No cervical lymphadenopathy appriciated.  Symmetrical Chest wall movement, Good air movement bilaterally, CTAB RRR,No Gallops,Rubs or new Murmurs, No Parasternal Heave +ve B.Sounds, Abd Soft, No tenderness, No organomegaly appriciated, No rebound - guarding or rigidity. No Cyanosis, Clubbing or edema, No new Rash or bruise     Data Review:   Micro Results Recent Results (from the past 240 hour(s))  MRSA PCR Screening     Status: None   Collection Time: 11/28/14 12:42 AM  Result Value Ref Range Status   MRSA by PCR NEGATIVE NEGATIVE Final    Comment:        The GeneXpert MRSA Assay (FDA approved for NASAL specimens only), is one component of a comprehensive MRSA colonization surveillance program. It is not intended to diagnose MRSA infection nor to guide or monitor treatment for MRSA infections.     Radiology Reports Dg Chest 2 View  11/27/2014   CLINICAL DATA:  Dyspnea  EXAM: CHEST  2 VIEW  COMPARISON:  02/10/2014  FINDINGS: Normal heart size and mediastinal contours. Mild atelectasis at the left base. No acute infiltrate or edema. No effusion or pneumothorax. Slight dextro thoracic curvature of the upper thoracic spine. No acute osseous findings.  IMPRESSION: Minor atelectasis at the left base.   Electronically Signed   By: Marnee Spring M.D.   On: 11/27/2014 21:01   Ct Angio Chest Pe  W/cm &/or Wo Cm  11/27/2014   CLINICAL DATA:  Shortness of Breath for 2 weeks  EXAM: CT ANGIOGRAPHY CHEST WITH CONTRAST  TECHNIQUE: Multidetector CT imaging of the chest was performed using the standard protocol during bolus administration of intravenous contrast. Multiplanar CT image reconstructions and MIPs were obtained to evaluate the vascular anatomy.  CONTRAST:  OMNIPAQUE IOHEXOL 350 MG/ML SOLN  COMPARISON:  Chest radiograph November 27, 2014  FINDINGS: There is extensive pulmonary embolism arising from the right main pulmonary artery extending into multiple right upper, middle, and lower lobe branches. There is pulmonary embolus on the left extending into multiple left lower lobe pulmonary arterial branches. The right ventricle to left ventricle ratio is 1.2, indicative of right heart strain.  There is no thoracic aortic aneurysm or dissection.  The lungs are clear. There is no appreciable thoracic adenopathy. There is a 5 mm nodular opacity in the right lobe of the thyroid. Thyroid otherwise appears unremarkable. The pericardium is not thickened.  In the visualized upper abdomen, there may be mild hepatic steatosis.  There are no blastic or lytic bone lesions.  Review of the MIP images confirms the above findings.  IMPRESSION: Extensive pulmonary embolus. Positive for acute PE with CT evidence of right heart strain (RV/LV Ratio = 1.2) consistent with at least submassive (intermediate risk) PE. The presence of right heart strain has been associated with an increased risk of morbidity and mortality. Please activate Code PE by paging [phone].  No lung edema or consolidation. No adenopathy. Small nodular opacity in the right lobe of the thyroid. Evidence of hepatic steatosis.  Critical Value/emergent results were called by telephone at the time of interpretation on 11/27/2014 at 9:44 pm to Dr. Pricilla Loveless , who verbally acknowledged these results.   Electronically Signed   By: Bretta Bang III M.D.    On: 11/27/2014 21:46     CBC  Recent Labs Lab 11/27/14 2000 11/28/14 0300  WBC 9.0 8.9  HGB 12.3 11.9*  HCT 37.3 36.8  PLT 282 277  MCV 86.5 88.0  MCH 28.5 28.5  MCHC 33.0 32.3  RDW 15.0 15.4  LYMPHSABS 3.5  --   MONOABS 0.7  --   EOSABS 0.2  --   BASOSABS 0.0  --     Chemistries   Recent Labs Lab 11/27/14 2000 11/28/14 0300  NA 141 142  K 3.6 3.8  CL 112* 113*  CO2 22 24  GLUCOSE 122* 111*  BUN 16 10  CREATININE 0.84 0.75  CALCIUM 8.9 8.4*   ------------------------------------------------------------------------------------------------------------------ estimated creatinine clearance is 103.3  mL/min (by C-G formula based on Cr of 0.75). ------------------------------------------------------------------------------------------------------------------ No results for input(s): HGBA1C in the last 72 hours. ------------------------------------------------------------------------------------------------------------------ No results for input(s): CHOL, HDL, LDLCALC, TRIG, CHOLHDL, LDLDIRECT in the last 72 hours. ------------------------------------------------------------------------------------------------------------------ No results for input(s): TSH, T4TOTAL, T3FREE, THYROIDAB in the last 72 hours.  Invalid input(s): FREET3 ------------------------------------------------------------------------------------------------------------------ No results for input(s): VITAMINB12, FOLATE, FERRITIN, TIBC, IRON, RETICCTPCT in the last 72 hours.  Coagulation profile No results for input(s): INR, PROTIME in the last 168 hours.   Recent Labs  11/27/14 2000  DDIMER 1.57*    Cardiac Enzymes  Recent Labs Lab 11/27/14 2000  TROPONINI <0.03   ------------------------------------------------------------------------------------------------------------------ Invalid input(s): POCBNP   Time Spent in minutes  35   SINGH,PRASHANT K M.D on 11/28/2014 at 11:02  AM  Between 7am to 7pm - Pager - [phone]  After 7pm go to [email or web address] - password [facility]  [facility]   Office  [phone]

## 2014-11-28 NOTE — Plan of Care (Signed)
Problem: Phase I Progression Outcomes Goal: Tolerating diet Outcome: Progressing Poor appetite.     

## 2014-11-28 NOTE — Discharge Instructions (Addendum)
Follow with Primary MD and recommended oncologist in 7 days   Get CBC, CMP, 2 view Chest X ray checked  by Primary MD next visit.    Activity: As tolerated with Full fall precautions use walker/cane & assistance as needed   Disposition Home     Diet: Heart Healthy   For Heart failure patients - Check your Weight same time everyday, if you gain over 2 pounds, or you develop in leg swelling, experience more shortness of breath or chest pain, call your Primary MD immediately. Follow Cardiac Low Salt Diet and 1.5 lit/day fluid restriction.   On your next visit with your primary care physician please Get Medicines reviewed and adjusted.   Please request your Prim.MD to go over all Hospital Tests and Procedure/Radiological results at the follow up, please get all Hospital records sent to your Prim MD by signing hospital release before you go home.   If you experience worsening of your admission symptoms, develop shortness of breath, life threatening emergency, suicidal or homicidal thoughts you must seek medical attention immediately by calling 911 or calling your MD immediately  if symptoms less severe.  You Must read complete instructions/literature along with all the possible adverse reactions/side effects for all the Medicines you take and that have been prescribed to you. Take any new Medicines after you have completely understood and accpet all the possible adverse reactions/side effects.   Do not drive, operating heavy machinery, perform activities at heights, swimming or participation in water activities or provide baby sitting services if your were admitted for syncope or siezures until you have seen by Primary MD or a Neurologist and advised to do so again.  Do not drive when taking Pain medications.    Do not take more than prescribed Pain, Sleep and Anxiety Medications  Special Instructions: If you have smoked or chewed Tobacco  in the last 2 yrs please stop smoking, stop any  regular Alcohol  and or any Recreational drug use.  Wear Seat belts while driving.   Please note  You were cared for by a hospitalist during your hospital stay. If you have any questions about your discharge medications or the care you received while you were in the hospital after you are discharged, you can call the unit and asked to speak with the hospitalist on call if the hospitalist that took care of you is not available. Once you are discharged, your primary care physician will handle any further medical issues. Please note that NO REFILLS for any discharge medications will be authorized once you are discharged, as it is imperative that you return to your primary care physician (or establish a relationship with a primary care physician if you do not have one) for your aftercare needs so that they can reassess your need for medications and monitor your lab values.   Information on my medicine - XARELTO (rivaroxaban)  This medication education was reviewed with me or my healthcare representative as part of my discharge preparation.   WHY WAS XARELTO PRESCRIBED FOR YOU? Xarelto was prescribed to treat blood clots that may have been found in the veins of your legs (deep vein thrombosis) or in your lungs (pulmonary embolism) and to reduce the risk of them occurring again.  What do you need to know about Xarelto? The starting dose is one 15 mg tablet taken TWICE daily with food for the FIRST 21 DAYS then on (enter date)  12/19/14  the dose is changed to one 20 mg tablet  taken ONCE A DAY with your evening meal.  DO NOT stop taking Xarelto without talking to the health care provider who prescribed the medication.  Refill your prescription for 20 mg tablets before you run out.  After discharge, you should have regular check-up appointments with your healthcare provider that is prescribing your Xarelto.  In the future your dose may need to be changed if your kidney function changes by a  significant amount.  What do you do if you miss a dose? If you are taking Xarelto TWICE DAILY and you miss a dose, take it as soon as you remember. You may take two 15 mg tablets (total 30 mg) at the same time then resume your regularly scheduled 15 mg twice daily the next day.  If you are taking Xarelto ONCE DAILY and you miss a dose, take it as soon as you remember on the same day then continue your regularly scheduled once daily regimen the next day. Do not take two doses of Xarelto at the same time.   Important Safety Information Xarelto is a blood thinner medicine that can cause bleeding. You should call your healthcare provider right away if you experience any of the following: ? Bleeding from an injury or your nose that does not stop. ? Unusual colored urine (red or dark brown) or unusual colored stools (red or black). ? Unusual bruising for unknown reasons. ? A serious fall or if you hit your head (even if there is no bleeding).  Some medicines may interact with Xarelto and might increase your risk of bleeding while on Xarelto. To help avoid this, consult your healthcare provider or pharmacist prior to using any new prescription or non-prescription medications, including herbals, vitamins, non-steroidal anti-inflammatory drugs (NSAIDs) and supplements.  This website has more information on Xarelto: VisitDestination.com.br.

## 2014-11-28 NOTE — Progress Notes (Signed)
  Echocardiogram 2D Echocardiogram has been performed.  Ellen SavoyCasey N Neliah Hood 11/28/2014, 12:50 PM

## 2014-11-28 NOTE — H&P (Signed)
History and Physical        Hospital Admission Note Date: 11/28/2014  Patient name: Ellen Hood Medical record number: [id number] Date of birth: 1970-10-23 Age: 44 y.o. Gender: female  PCP: No PCP Per Patient  Referring physician: Dr Criss Alvine  Chief Complaint:  Shortness of breath with exertion last 2 weeks  HPI: Patient is a 44 year old female, with no significant past medical history presented to a ER with shortness of breath, progressively worsening over the last 2 weeks. Patient reports that she had noticed intermittent shortness of breath over a month however her symptoms progressively worsened in the last 2 weeks to the point that she had dyspnea with minimal exertion. She lives on the third floor of the apartment complex and she was extremely winded going up and down the flight of stairs and feeling dizzy. She did have one episode of chest pain 2 weeks ago. She also reported nausea with dizziness. Patient reported that today she was walking down the [employer] store when she became extremely short of breath and hence came to the ED. She denied any orthopnea, PND or gain of weight. She did notice that her legs were getting tight and had pitting edema. ER workup showed an unremarkable CBC, BMET however d-dimer was elevated at 1.57. CT angiogram of the chest showed extensive pulmonary emboli associated with right heart strain. Patient denied taking any birth control pills (has history of hysterectomy), any long distance car travels or air flights. She did however admitted to having strong family history of clotting disorder. Patient reports that her grandmother, mother and her younger sister all have pulmonary embolisms. She is not however unaware of genetic workup for any of them.  Review of Systems:  Constitutional: Denies fever, chills, diaphoresis, poor appetite and fatigue.    HEENT: Denies photophobia, eye pain, redness, hearing loss, ear pain, congestion, sore throat, rhinorrhea, sneezing, mouth sores, trouble swallowing, neck pain, neck stiffness and tinnitus.   Respiratory: Please see history of present illness  Cardiovascular: Please see history of present illness Gastrointestinal: +nausea, vomiting, denied abdominal pain, diarrhea, constipation, blood in stool and abdominal distention.  Genitourinary: Denies dysuria, urgency, frequency, hematuria, flank pain and difficulty urinating.  Musculoskeletal: Denies myalgias, back pain, joint swelling, arthralgias and gait problem.  Skin: Denies pallor, rash and wound.  Neurological: Denies  seizures, syncope, weakness, numbness and headaches.  dizziness and lightheadedness+ Hematological: Denies adenopathy.Please see history of present illness  Psychiatric/Behavioral: Denies suicidal ideation, mood changes, confusion, nervousness, sleep disturbance and agitation  Past Medical History: Past Medical History  Diagnosis Date  . Migraine   . Endometriosis     Past Surgical History  Procedure Laterality Date  . Abdominal hysterectomy    . Cesarean section    . Hernia repair    . Laparoscopy      Medications: Prior to Admission medications   Medication Sig Start Date End Date Taking? Authorizing Provider  topiramate (TOPAMAX) 100 MG tablet Take 100 mg by mouth daily.   Yes Historical Provider, MD  cephALEXin (KEFLEX) 500 MG capsule Take 1 capsule (500 mg total) by mouth 4 (four) times daily. 02/25/13   Blake Divine, MD  diphenhydrAMINE (BENADRYL)  25 MG tablet Take 25 mg by mouth at bedtime.      Historical Provider, MD  Multiple Vitamin (MULITIVITAMIN WITH MINERALS) TABS Take 1 tablet by mouth daily.      Historical Provider, MD    Allergies:  No Known Allergies  Social History:  reports that she has never smoked. She does not have any smokeless tobacco history on file. She reports that she does not drink  alcohol or use illicit drugs.  Family History: History reviewed. No pertinent family history.  Physical Exam: Blood pressure 105/83, pulse 72, temperature 97.7 F (36.5 C), temperature source Oral, resp. rate 16, height  (1.676 m), weight 93.3 kg (205 lb 11 oz), SpO2 100 %. General: Alert, awake, oriented x3, in no acute distress. HEENT: normocephalic, atraumatic, anicteric sclera, pink conjunctiva, pupils equal and reactive to light and accomodation, oropharynx clear Neck: supple, no masses or lymphadenopathy, no goiter, no bruits  Heart: Regular rate and rhythm, without murmurs, rubs or gallops. Lungs: Clear to auscultation bilaterally, no wheezing, rales or rhonchi. Abdomen: Soft, nontender, nondistended, positive bowel sounds, no masses. Extremities: No clubbing, cyanosis, 1+ edema with positive pedal pulses. Neuro: Grossly intact, no focal neurological deficits, strength 5/5 upper and lower extremities bilaterally Psych: alert and oriented x 3, normal mood and affect Skin: no rashes or lesions, warm and dry   LABS on Admission:  Basic Metabolic Panel:  Recent Labs Lab 11/27/14 2000  NA 141  K 3.6  CL 112*  CO2 22  GLUCOSE 122*  BUN 16  CREATININE 0.84  CALCIUM 8.9   Liver Function Tests: No results for input(s): AST, ALT, ALKPHOS, BILITOT, PROT, ALBUMIN in the last 168 hours. No results for input(s): LIPASE, AMYLASE in the last 168 hours. No results for input(s): AMMONIA in the last 168 hours. CBC:  Recent Labs Lab 11/27/14 2000  WBC 9.0  NEUTROABS 4.7  HGB 12.3  HCT 37.3  MCV 86.5  PLT 282   Cardiac Enzymes:  Recent Labs Lab 11/27/14 2000  TROPONINI <0.03   BNP: Invalid input(s): POCBNP CBG: No results for input(s): GLUCAP in the last 168 hours.  Radiological Exams on Admission:  Dg Chest 2 View  11/27/2014   CLINICAL DATA:  Dyspnea  EXAM: CHEST  2 VIEW  COMPARISON:  02/10/2014  FINDINGS: Normal heart size and mediastinal contours. Mild  atelectasis at the left base. No acute infiltrate or edema. No effusion or pneumothorax. Slight dextro thoracic curvature of the upper thoracic spine. No acute osseous findings.  IMPRESSION: Minor atelectasis at the left base.   Electronically Signed   By: Marnee Spring M.D.   On: 11/27/2014 21:01   Ct Angio Chest Pe W/cm &/or Wo Cm  11/27/2014   CLINICAL DATA:  Shortness of Breath for 2 weeks  EXAM: CT ANGIOGRAPHY CHEST WITH CONTRAST  TECHNIQUE: Multidetector CT imaging of the chest was performed using the standard protocol during bolus administration of intravenous contrast. Multiplanar CT image reconstructions and MIPs were obtained to evaluate the vascular anatomy.  CONTRAST:  OMNIPAQUE IOHEXOL 350 MG/ML SOLN  COMPARISON:  Chest radiograph November 27, 2014  FINDINGS: There is extensive pulmonary embolism arising from the right main pulmonary artery extending into multiple right upper, middle, and lower lobe branches. There is pulmonary embolus on the left extending into multiple left lower lobe pulmonary arterial branches. The right ventricle to left ventricle ratio is 1.2, indicative of right heart strain.  There is no thoracic aortic aneurysm or dissection.  The  lungs are clear. There is no appreciable thoracic adenopathy. There is a 5 mm nodular opacity in the right lobe of the thyroid. Thyroid otherwise appears unremarkable. The pericardium is not thickened.  In the visualized upper abdomen, there may be mild hepatic steatosis.  There are no blastic or lytic bone lesions.  Review of the MIP images confirms the above findings.  IMPRESSION: Extensive pulmonary embolus. Positive for acute PE with CT evidence of right heart strain (RV/LV Ratio = 1.2) consistent with at least submassive (intermediate risk) PE. The presence of right heart strain has been associated with an increased risk of morbidity and mortality. Please activate Code PE by paging [phone].  No lung edema or consolidation. No  adenopathy. Small nodular opacity in the right lobe of the thyroid. Evidence of hepatic steatosis.  Critical Value/emergent results were called by telephone at the time of interpretation on 11/27/2014 at 9:44 pm to Dr. Pricilla LovelessSCOTT GOLDSTON , who verbally acknowledged these results.   Electronically Signed   By: Bretta BangWilliam  Woodruff III M.D.   On: 11/27/2014 21:46    *I have personally reviewed the images above*  EKG: Independently reviewed Rate 80, normal sinus rhythm, no acute ST-T wave changes suggestive of ischemia   Assessment/Plan Principal Problem:   Acute submassive Pulmonary embolism: Patient with strong family history of pulmonary embolism - Admit to stepdown, placed on heparin drip.  - Obtain hypercoagulable workup, 2-D echocardiogram, Doppler ultrasound of the lower extremities - Case management consult for NOAC's in AM, patient was given all the options of heparin drip/Lovenox with coumadin vs NOAC's. Patient preferred NOAC's.  Active Problems:   Dyspnea: Likely due to #1 - Follow 2-D echocardiogram   DVT prophylaxis: On heparin drip  CODE STATUS: Full CODE STATUS  Family Communication: Admission, patients condition and plan of care including tests being ordered have been discussed with the patient  who indicates understanding and agree with the plan and Code Status  Disposition plan: Further plan will depend as patient's clinical course evolves and further radiologic and laboratory data become available.   Time Spent on Admission: 60 mins   Melvern Ramone M.D. [facility] 11/28/2014, 2:14 AM Pager: [phone]  If 7PM-7AM, please contact night-coverage [email or web address] Password [email or web address]

## 2014-11-28 NOTE — Care Management Note (Signed)
Case Management Note  Patient Details  Name: Ellen Hood MRN:Rick Duff [id number] Date of Birth: October 23, 1970  Subjective/Objective:     Pt will discharge on Xarelto which is a Medicaid preferred medication, copay will be $3.00.  Provided pt with card for free 30-day trial.               Verdis PrimeMayo, Anabeth Chilcott T, RN 11/28/2014, 9:03 AM

## 2014-11-28 NOTE — Progress Notes (Signed)
Bilateral lower extremity venous duplex completed:  No evidence of DVT, superficial thrombosis, or Baker's cyst.   

## 2014-11-28 NOTE — Progress Notes (Addendum)
ANTICOAGULATION CONSULT NOTE - Follow-up  Pharmacy Consult for Heparin>>apixaban Indication: pulmonary embolus  No Known Allergies  Patient Measurements: Height: 5\' 6"  (167.6 cm) Weight: 205 lb 11 oz (93.3 kg) IBW/kg (Calculated) : 59.3 Heparin Dosing Weight: 79 kg  Vital Signs: Temp: 97.9 F (36.6 C) (07/08 0804) Temp Source: Oral (07/08 0804) BP: 114/70 mmHg (07/08 0804) Pulse Rate: 68 (07/08 0804)  Labs:  Recent Labs  11/27/14 2000 11/28/14 0300  HGB 12.3 11.9*  HCT 37.3 36.8  PLT 282 277  CREATININE 0.84 0.75  TROPONINI <0.03  --     Estimated Creatinine Clearance: 103.3 mL/min (by C-G formula based on Cr of 0.75).  Assessment: 44yo female with history of migraines presents with SOB associated with dizziness and nausea. Found to have a submassive PE and initiated on heparin IV. Now transitioning to apixaban. H/H 11.9/36.8, plts 277, no bleeding noted, good renal fxn. Hypercoagulable work-up in process.  Goal of Therapy:  Therapeutic anticoagulation Monitor platelets by anticoagulation protocol: Yes   Plan:  - Initiated apixaban 10mg  PO BID x 7 days (start at the time of heparin discontinuation) then 5mg  PO BID - F/u S&S of bleeding, renal function  Lysle Pearlachel Rumbarger, PharmD, BCPS Pager # [phone] 11/28/2014 8:48 AM   Addendum: Changing apixban to xarelto due to insurance coverage.  Renal function is good.  Patient got one dose of apixaban 10 mg this morning.  Plan  - Xarelto 15 mg po bid x 21 days (starting tonight), then 20 mg po daily - F/u S&S of bleeding, renal fxn   Clyda HurdleJeremy Lucee Brissett, PharmD BCPS

## 2014-11-29 LAB — CBC
HCT: 35.4 % — ABNORMAL LOW (ref 36.0–46.0)
Hemoglobin: 11.6 g/dL — ABNORMAL LOW (ref 12.0–15.0)
MCH: 28.5 pg (ref 26.0–34.0)
MCHC: 32.8 g/dL (ref 30.0–36.0)
MCV: 87 fL (ref 78.0–100.0)
Platelets: 275 10*3/uL (ref 150–400)
RBC: 4.07 MIL/uL (ref 3.87–5.11)
RDW: 15.4 % (ref 11.5–15.5)
WBC: 8.9 10*3/uL (ref 4.0–10.5)

## 2014-11-29 MED ORDER — RIVAROXABAN 20 MG PO TABS: 20.0000 mg | ORAL_TABLET | Freq: Every day | ORAL | Status: DC

## 2014-11-29 MED ORDER — RIVAROXABAN 15 MG PO TABS: 15.0000 mg | ORAL_TABLET | Freq: Two times a day (BID) | ORAL | Status: DC

## 2014-11-29 NOTE — Discharge Summary (Signed)
Ellen Hood, is a 44 y.o. female  DOB 27-Apr-1971  MRN [id number].  Admission date:  11/27/2014  Admitting Physician  Yevonne Pax, MD  Discharge Date:  11/29/2014   Primary MD  No PCP Per Patient  Recommendations for primary care physician for things to follow:   Monitor hypercoagulable panel, must follow with recommended hematologist within 1-2 weeks.   Admission Diagnosis  Pulmonary embolism [I26.99]   Discharge Diagnosis  Pulmonary embolism [I26.99]    Principal Problem:   Pulmonary embolism Active Problems:   Dyspnea      Past Medical History  Diagnosis Date  . Migraine   . Endometriosis     Past Surgical History  Procedure Laterality Date  . Abdominal hysterectomy    . Cesarean section    . Hernia repair    . Laparoscopy         HPI  from the history and physical done on the day of admission:    Patient is a 44 year old female, with no significant past medical history presented to a ER with shortness of breath, progressively worsening over the last 2 weeks. Patient reports that she had noticed intermittent shortness of breath over a month however her symptoms progressively worsened in the last 2 weeks to the point that she had dyspnea with minimal exertion. She lives on the third floor of the apartment complex and she was extremely winded going up and down the flight of stairs and feeling dizzy. She did have one episode of chest pain 2 weeks ago. She also reported nausea with dizziness. Patient reported that today she was walking down the [employer] for store when she became extremely short of breath and hence came to the ED. She denied any orthopnea, PND or gain of weight. She did notice that her legs were getting tight and had pitting edema.  ER workup showed an unremarkable CBC, BMET however  d-dimer was elevated at 1.57. CT angiogram of the chest showed extensive pulmonary emboli associated with right heart strain. Patient denied taking any birth control pills (has history of hysterectomy), any long distance car travels or air flights. She did however admitted to having strong family history of clotting disorder. Patient reports that her grandmother, mother and her younger sister all have pulmonary embolisms. She is not however unaware of genetic workup for any of them.     Hospital Course:     1. Acute hypoxic respiratory failure due to submassive PE with evidence of right heart strain on CT angiogram - on heparin drip overnight, has been transitioned to xaralto, her insurance covers xaralto she has been evaluated by case management. Stable echo and lower extremity duplex. Ending hypercoagulable panel. She is now symptom-free at rest, minimal shortness of breath on ablation. Will be discharged home with outpatient follow-up with hematology and PCP.   Her PCP is in [facility] not listed in the system and she has been requested to take a copy of this discharge summary to PCP. Have also recommended her local community and  wellness clinic in [place]. Has been extensively counseled that she must follow with the recommended hematologist within the next month for hypercoagulable workup.          Discharge Condition: Stable  Follow UP  Follow-up Information    Follow up with [facility]    . Schedule an appointment as soon as possible for a visit in 1 week.   Why:  PE   Contact information:   [address] [place] [address] [phone]      Follow up with [facility], NI, MD. Schedule an appointment as soon as possible for a visit in 1 week.   Specialty:  Hematology and Oncology   Why:  PE   Contact information:   [address] [place] [address] [phone]        Consults obtained - None  Diet and Activity  recommendation: See Discharge Instructions below  Discharge Instructions       Discharge Instructions    Diet - low sodium heart healthy    Complete by:  As directed      Discharge instructions    Complete by:  As directed   Follow with Primary MD and recommended oncologist in 7 days   Get CBC, CMP, 2 view Chest X ray checked  by Primary MD next visit.    Activity: As tolerated with Full fall precautions use walker/cane & assistance as needed   Disposition Home     Diet: Heart Healthy   For Heart failure patients - Check your Weight same time everyday, if you gain over 2 pounds, or you develop in leg swelling, experience more shortness of breath or chest pain, call your Primary MD immediately. Follow Cardiac Low Salt Diet and 1.5 lit/day fluid restriction.   On your next visit with your primary care physician please Get Medicines reviewed and adjusted.   Please request your Prim.MD to go over all Hospital Tests and Procedure/Radiological results at the follow up, please get all Hospital records sent to your Prim MD by signing hospital release before you go home.   If you experience worsening of your admission symptoms, develop shortness of breath, life threatening emergency, suicidal or homicidal thoughts you must seek medical attention immediately by calling 911 or calling your MD immediately  if symptoms less severe.  You Must read complete instructions/literature along with all the possible adverse reactions/side effects for all the Medicines you take and that have been prescribed to you. Take any new Medicines after you have completely understood and accpet all the possible adverse reactions/side effects.   Do not drive, operating heavy machinery, perform activities at heights, swimming or participation in water activities or provide baby sitting services if your were admitted for syncope or siezures until you have seen by Primary MD or a Neurologist and advised to do so  again.  Do not drive when taking Pain medications.    Do not take more than prescribed Pain, Sleep and Anxiety Medications  Special Instructions: If you have smoked or chewed Tobacco  in the last 2 yrs please stop smoking, stop any regular Alcohol  and or any Recreational drug use.  Wear Seat belts while driving.   Please note  You were cared for by a hospitalist during your hospital stay. If you have any questions about your discharge medications or the care you received while you were in the hospital after you are discharged, you can call the unit and asked to  speak with the hospitalist on call if the hospitalist that took care of you is not available. Once you are discharged, your primary care physician will handle any further medical issues. Please note that NO REFILLS for any discharge medications will be authorized once you are discharged, as it is imperative that you return to your primary care physician (or establish a relationship with a primary care physician if you do not have one) for your aftercare needs so that they can reassess your need for medications and monitor your lab values.     Increase activity slowly    Complete by:  As directed              Discharge Medications       Medication List    STOP taking these medications        cephALEXin 500 MG capsule  Commonly known as:  KEFLEX      TAKE these medications        diphenhydrAMINE 25 MG tablet  Commonly known as:  BENADRYL  Take 25 mg by mouth at bedtime.     multivitamin with minerals Tabs tablet  Take 1 tablet by mouth daily.     Rivaroxaban 15 MG Tabs tablet  Commonly known as:  XARELTO  Take 1 tablet (15 mg total) by mouth 2 (two) times daily with a meal.     rivaroxaban 20 MG Tabs tablet  Commonly known as:  XARELTO  Take 1 tablet (20 mg total) by mouth daily with supper.  Start taking on:  12/19/2014     topiramate 100 MG tablet  Commonly known as:  TOPAMAX  Take 100 mg by mouth daily.         Major procedures and Radiology Reports - PLEASE review detailed and final reports for all details, in brief -   TTE  - Left ventricle: The cavity size was normal. Wall thickness wasincreased in a pattern of mild LVH. Systolic function was normal.The estimated ejection fraction was in the range of 55% to 60%.Wall motion was normal; there were no regional wall motionabnormalities. Left ventricular diastolic function parameterswere normal. LV was not well visualized.    Lower extremity venous duplex - Bilateral lower extremity venous duplex completed: No evidence of DVT, superficial thrombosis, or Baker's cyst.   Dg Chest 2 View  11/27/2014   CLINICAL DATA:  Dyspnea  EXAM: CHEST  2 VIEW  COMPARISON:  02/10/2014  FINDINGS: Normal heart size and mediastinal contours. Mild atelectasis at the left base. No acute infiltrate or edema. No effusion or pneumothorax. Slight dextro thoracic curvature of the upper thoracic spine. No acute osseous findings.  IMPRESSION: Minor atelectasis at the left base.   Electronically Signed   By: Marnee Spring M.D.   On: 11/27/2014 21:01   Ct Angio Chest Pe W/cm &/or Wo Cm  11/27/2014   CLINICAL DATA:  Shortness of Breath for 2 weeks  EXAM: CT ANGIOGRAPHY CHEST WITH CONTRAST  TECHNIQUE: Multidetector CT imaging of the chest was performed using the standard protocol during bolus administration of intravenous contrast. Multiplanar CT image reconstructions and MIPs were obtained to evaluate the vascular anatomy.  CONTRAST:  OMNIPAQUE IOHEXOL 350 MG/ML SOLN  COMPARISON:  Chest radiograph November 27, 2014  FINDINGS: There is extensive pulmonary embolism arising from the right main pulmonary artery extending into multiple right upper, middle, and lower lobe branches. There is pulmonary embolus on the left extending into multiple left lower lobe pulmonary arterial branches. The right ventricle  to left ventricle ratio is 1.2, indicative of right heart strain.  There  is no thoracic aortic aneurysm or dissection.  The lungs are clear. There is no appreciable thoracic adenopathy. There is a 5 mm nodular opacity in the right lobe of the thyroid. Thyroid otherwise appears unremarkable. The pericardium is not thickened.  In the visualized upper abdomen, there may be mild hepatic steatosis.  There are no blastic or lytic bone lesions.  Review of the MIP images confirms the above findings.  IMPRESSION: Extensive pulmonary embolus. Positive for acute PE with CT evidence of right heart strain (RV/LV Ratio = 1.2) consistent with at least submassive (intermediate risk) PE. The presence of right heart strain has been associated with an increased risk of morbidity and mortality. Please activate Code PE by paging [phone].  No lung edema or consolidation. No adenopathy. Small nodular opacity in the right lobe of the thyroid. Evidence of hepatic steatosis.  Critical Value/emergent results were called by telephone at the time of interpretation on 11/27/2014 at 9:44 pm to Dr. Pricilla LovelessSCOTT GOLDSTON , who verbally acknowledged these results.   Electronically Signed   By: Bretta BangWilliam  Woodruff III M.D.   On: 11/27/2014 21:46    Micro Results      Recent Results (from the past 240 hour(s))  MRSA PCR Screening     Status: None   Collection Time: 11/28/14 12:42 AM  Result Value Ref Range Status   MRSA by PCR NEGATIVE NEGATIVE Final    Comment:        The GeneXpert MRSA Assay (FDA approved for NASAL specimens only), is one component of a comprehensive MRSA colonization surveillance program. It is not intended to diagnose MRSA infection nor to guide or monitor treatment for MRSA infections.        Today   Subjective    Ellen Hood today has no headache,no chest abdominal pain,no new weakness tingling or numbness, feels much better wants to go home today.     Objective   Blood pressure 104/69, pulse 69, temperature 97.8 F (36.6 C), temperature source Oral, resp. rate  18, height 5\' 6"  (1.676 m), weight 93.7 kg (206 lb 9.1 oz), SpO2 98 %.   Intake/Output Summary (Last 24 hours) at 11/29/14 0943 Last data filed at 11/29/14 0600  Gross per 24 hour  Intake   1334 ml  Output   1900 ml  Net   -566 ml    Exam Awake Alert, Oriented x 3, No new F.N deficits, Normal affect [place].AT,PERRAL Supple Neck,No JVD, No cervical lymphadenopathy appriciated.  Symmetrical Chest wall movement, Good air movement bilaterally, CTAB RRR,No Gallops,Rubs or new Murmurs, No Parasternal Heave +ve B.Sounds, Abd Soft, Non tender, No organomegaly appriciated, No rebound -guarding or rigidity. No Cyanosis, Clubbing or edema, No new Rash or bruise   Data Review   CBC w Diff: Lab Results  Component Value Date   WBC 8.9 11/29/2014   HGB 11.6* 11/29/2014   HCT 35.4* 11/29/2014   PLT 275 11/29/2014   LYMPHOPCT 38 11/27/2014   MONOPCT 8 11/27/2014   EOSPCT 2 11/27/2014   BASOPCT 0 11/27/2014    CMP: Lab Results  Component Value Date   NA 142 11/28/2014   K 3.8 11/28/2014   CL 113* 11/28/2014   CO2 24 11/28/2014   BUN 10 11/28/2014   CREATININE 0.75 11/28/2014   PROT 8.0 05/02/2013   ALBUMIN 3.4* 05/02/2013   BILITOT 0.3 05/02/2013   ALKPHOS 121* 05/02/2013   AST 17 05/02/2013  ALT 20 05/02/2013  .   Total Time in preparing paper work, data evaluation and todays exam - 35 minutes  Leroy Sea M.D on 11/29/2014 at 9:43 AM  [facility]   Office  [phone]

## 2014-11-30 LAB — HOMOCYSTEINE: HOMOCYSTEINE-NORM: 7.9 umol/L (ref 0.0–15.0)

## 2014-12-01 LAB — CARDIOLIPIN ANTIBODIES, IGG, IGM, IGA
Anticardiolipin IgA: <9 APL U/mL (ref 0–11)
Anticardiolipin IgG: <9 GPL U/mL (ref 0–14)
Anticardiolipin IgM: <9 MPL U/mL (ref 0–12)

## 2014-12-01 LAB — BETA-2-GLYCOPROTEIN I ABS, IGG/M/A
Beta-2-Glycoprotein I IgA: <9 GPI IgA units (ref 0–25)
Beta-2-Glycoprotein I IgM: <9 GPI IgM units (ref 0–32)

## 2014-12-01 LAB — PROTEIN C, TOTAL: Protein C, Total: 95 % (ref 70–140)

## 2014-12-02 LAB — FACTOR 5 LEIDEN

## 2014-12-02 LAB — LUPUS ANTICOAGULANT PANEL
DRVVT: 49.2 s (ref 0.0–55.1)
PTT Lupus Anticoagulant: 42.1 s (ref 0.0–50.0)

## 2014-12-02 LAB — PROTEIN S, TOTAL: Protein S Ag, Total: 117 % (ref 58–150)

## 2014-12-02 LAB — PROTHROMBIN GENE MUTATION

## 2014-12-02 LAB — PROTEIN S ACTIVITY: Protein S Activity: 102 % (ref 60–145)

## 2014-12-02 LAB — PROTEIN C ACTIVITY: Protein C Activity: 141 % (ref 74–151)

## 2014-12-05 ENCOUNTER — Telehealth: Payer: Self-pay | Admitting: Hematology & Oncology

## 2014-12-05 NOTE — Telephone Encounter (Signed)
New patient appt-s/w patient and gave np aptp for 08/15 @ 1 w/Dr. Myna HidalgoEnnever.

## 2014-12-09 ENCOUNTER — Encounter (HOSPITAL_BASED_OUTPATIENT_CLINIC_OR_DEPARTMENT_OTHER): Payer: Self-pay | Admitting: Emergency Medicine

## 2014-12-09 ENCOUNTER — Telehealth: Payer: Self-pay

## 2014-12-09 ENCOUNTER — Emergency Department (HOSPITAL_BASED_OUTPATIENT_CLINIC_OR_DEPARTMENT_OTHER)
Admission: EM | Admit: 2014-12-09 | Discharge: 2014-12-09 | Disposition: A | Discharge status: Home / Self Care | Payer: Medicaid Other | Attending: Emergency Medicine | Admitting: Emergency Medicine

## 2014-12-09 DIAGNOSIS — G43909 Migraine, unspecified, not intractable, without status migrainosus: Secondary | ICD-10-CM | POA: Insufficient documentation

## 2014-12-09 DIAGNOSIS — Z7982 Long term (current) use of aspirin: Secondary | ICD-10-CM | POA: Diagnosis not present

## 2014-12-09 DIAGNOSIS — R319 Hematuria, unspecified: Secondary | ICD-10-CM | POA: Diagnosis not present

## 2014-12-09 DIAGNOSIS — Z79899 Other long term (current) drug therapy: Secondary | ICD-10-CM | POA: Diagnosis not present

## 2014-12-09 DIAGNOSIS — Z7901 Long term (current) use of anticoagulants: Secondary | ICD-10-CM | POA: Insufficient documentation

## 2014-12-09 LAB — CBC
HEMATOCRIT: 39.9 % (ref 36.0–46.0)
Hemoglobin: 13.1 g/dL (ref 12.0–15.0)
MCH: 28.4 pg (ref 26.0–34.0)
MCHC: 32.8 g/dL (ref 30.0–36.0)
MCV: 86.6 fL (ref 78.0–100.0)
Platelets: 301 10*3/uL (ref 150–400)
RBC: 4.61 MIL/uL (ref 3.87–5.11)
RDW: 15 % (ref 11.5–15.5)
WBC: 11 10*3/uL — ABNORMAL HIGH (ref 4.0–10.5)

## 2014-12-09 LAB — URINALYSIS, ROUTINE W REFLEX MICROSCOPIC
BILIRUBIN URINE: NEGATIVE
Glucose, UA: NEGATIVE mg/dL
Ketones, ur: NEGATIVE mg/dL
LEUKOCYTES UA: NEGATIVE
Nitrite: NEGATIVE
Protein, ur: 100 mg/dL — AB
Specific Gravity, Urine: 1.019 (ref 1.005–1.030)
UROBILINOGEN UA: 1 mg/dL (ref 0.0–1.0)
pH: 7 (ref 5.0–8.0)

## 2014-12-09 LAB — URINE MICROSCOPIC-ADD ON

## 2014-12-09 LAB — BASIC METABOLIC PANEL
Anion gap: 8 (ref 5–15)
BUN: 17 mg/dL (ref 6–20)
CHLORIDE: 106 mmol/L (ref 101–111)
CO2: 24 mmol/L (ref 22–32)
CREATININE: 0.95 mg/dL (ref 0.44–1.00)
Calcium: 8.9 mg/dL (ref 8.9–10.3)
GFR calc Af Amer: >60 mL/min (ref >60)
GLUCOSE: 124 mg/dL — AB (ref 65–99)
Potassium: 3.5 mmol/L (ref 3.5–5.1)
SODIUM: 138 mmol/L (ref 135–145)

## 2014-12-09 NOTE — ED Notes (Addendum)
Pt c/o blood in urine x 1 days,  Denies any pain, no vag dc,  Started on xarelto on July 9

## 2014-12-09 NOTE — ED Provider Notes (Signed)
CSN: [id number]     Arrival date & time 12/09/14  1921 History   First MD Initiated Contact with Patient 12/09/14 1936     Chief Complaint  Patient presents with  . Hematuria     (Consider location/radiation/quality/duration/timing/severity/associated sxs/prior Treatment) HPI Comments: 44 year old female presenting with hematuria 1 day. Patient went back to work today after being discharged from the hospital on 11/29/2014 with a pulmonary embolism, and while she was at work, states she had a large amount of blood in her urine stream each time she urinated. She was started on Xarelto in the hospital and has been taking 15 mg BID. She felt warm last night but did not have a fever. Denies abdominal pain, back pain, chills, nausea, vomiting, dysuria, increased urinary frequency or urgency. Denies vaginal bleeding or rectal bleeding.  Patient is a 44 y.o. female presenting with hematuria. The history is provided by the patient.  Hematuria This is a new problem. The current episode started today. The problem occurs constantly. The problem has been unchanged. Nothing aggravates the symptoms. She has tried nothing for the symptoms.    Past Medical History  Diagnosis Date  . Migraine   . Endometriosis    Past Surgical History  Procedure Laterality Date  . Abdominal hysterectomy    . Cesarean section    . Hernia repair    . Laparoscopy     History reviewed. No pertinent family history. History  Substance Use Topics  . Smoking status: Never Smoker   . Smokeless tobacco: Not on file  . Alcohol Use: No   OB History    No data available     Review of Systems  Genitourinary: Positive for hematuria.  All other systems reviewed and are negative.     Allergies  Review of patient's allergies indicates no known allergies.  Home Medications   Prior to Admission medications   Medication Sig Start Date End Date Taking? Authorizing Provider  diphenhydrAMINE (BENADRYL) 25 MG tablet Take  25 mg by mouth at bedtime.      Historical Provider, MD  Multiple Vitamin (MULITIVITAMIN WITH MINERALS) TABS Take 1 tablet by mouth daily.      Historical Provider, MD  Rivaroxaban (XARELTO) 15 MG TABS tablet Take 1 tablet (15 mg total) by mouth 2 (two) times daily with a meal. 11/29/14 12/18/14  Leroy Sea, MD  rivaroxaban (XARELTO) 20 MG TABS tablet Take 1 tablet (20 mg total) by mouth daily with supper. 12/19/14   Leroy Sea, MD  topiramate (TOPAMAX) 100 MG tablet Take 100 mg by mouth at bedtime.     Historical Provider, MD   BP 142/101 mmHg  Pulse 98  Temp(Src) 98.3 F (36.8 C) (Oral)  Resp 18  Ht  (1.676 m)  Wt 198 lb (89.812 kg)  BMI 31.97 kg/m2  SpO2 98% Physical Exam  Constitutional: She is oriented to person, place, and time. She appears well-developed and well-nourished. No distress.  HENT:  Head: Normocephalic and atraumatic.  Mouth/Throat: Oropharynx is clear and moist.  Eyes: Conjunctivae and EOM are normal.  Neck: Normal range of motion. Neck supple.  Cardiovascular: Normal rate, regular rhythm and normal heart sounds.   Pulmonary/Chest: Effort normal and breath sounds normal. No respiratory distress.  Abdominal: Soft. Bowel sounds are normal. She exhibits no distension. There is no tenderness.  No CVAT.  Musculoskeletal: Normal range of motion. She exhibits no edema.  Neurological: She is alert and oriented to person, place, and time. No sensory  deficit.  Skin: Skin is warm and dry. No pallor.  Psychiatric: She has a normal mood and affect. Her behavior is normal.  Nursing note and vitals reviewed.   ED Course  Procedures (including critical care time) Labs Review Labs Reviewed  URINALYSIS, ROUTINE W REFLEX MICROSCOPIC (NOT AT [facility]) - Abnormal; Notable for the following:    Color, Urine BROWN (*)    APPearance TURBID (*)    Hgb urine dipstick LARGE (*)    Protein, ur 100 (*)    All other components within normal limits  URINE MICROSCOPIC-ADD ON -  Abnormal; Notable for the following:    Squamous Epithelial / LPF FEW (*)    Bacteria, UA FEW (*)    All other components within normal limits  CBC - Abnormal; Notable for the following:    WBC 11.0 (*)    All other components within normal limits  BASIC METABOLIC PANEL - Abnormal; Notable for the following:    Glucose, Bld 124 (*)    All other components within normal limits    Imaging Review No results found.   EKG Interpretation None      MDM   Final diagnoses:  Hematuria   Nontoxic appearing, NAD. AF VSS. Hemodynamically stable. Hematuria without UTI. No associated pain or symptoms. Doubt pyelo or stone. Recently started on Xarelto as stated above. Hemoglobin and kidney functions within normal limits. Advised follow-up with urology. Continue Xarelto. Stable for discharge. Return precautions given. Patient states understanding of treatment care plan and is agreeable.  Discussed with attending Dr. Rubin PayorPickering who agrees with plan of care.  Kathrynn SpeedRobyn M Angila Wombles, PA-C 12/09/14 2043  Benjiman CoreNathan Pickering, MD 12/10/14 2121

## 2014-12-09 NOTE — ED Notes (Signed)
Patient states that she was recently placed on xorelto for a blood clot in her lungs. The patient reports that she started to have hematuria since this afternoon

## 2014-12-09 NOTE — Telephone Encounter (Signed)
LVM for pt to call back and confirm new pt appt on 01/05/15 starting at 1:00pm

## 2014-12-09 NOTE — Discharge Instructions (Signed)

## 2014-12-21 ENCOUNTER — Emergency Department (HOSPITAL_BASED_OUTPATIENT_CLINIC_OR_DEPARTMENT_OTHER)
Admission: EM | Admit: 2014-12-21 | Discharge: 2014-12-21 | Disposition: A | Discharge status: Home / Self Care | Payer: Medicaid Other | Attending: Emergency Medicine | Admitting: Emergency Medicine

## 2014-12-21 ENCOUNTER — Encounter (HOSPITAL_BASED_OUTPATIENT_CLINIC_OR_DEPARTMENT_OTHER): Payer: Self-pay | Admitting: *Deleted

## 2014-12-21 ENCOUNTER — Emergency Department (HOSPITAL_BASED_OUTPATIENT_CLINIC_OR_DEPARTMENT_OTHER): Payer: Medicaid Other

## 2014-12-21 DIAGNOSIS — R51 Headache: Secondary | ICD-10-CM | POA: Diagnosis present

## 2014-12-21 DIAGNOSIS — G43909 Migraine, unspecified, not intractable, without status migrainosus: Secondary | ICD-10-CM | POA: Insufficient documentation

## 2014-12-21 DIAGNOSIS — R519 Headache, unspecified: Secondary | ICD-10-CM

## 2014-12-21 DIAGNOSIS — Z86711 Personal history of pulmonary embolism: Secondary | ICD-10-CM | POA: Insufficient documentation

## 2014-12-21 DIAGNOSIS — Z8742 Personal history of other diseases of the female genital tract: Secondary | ICD-10-CM | POA: Diagnosis not present

## 2014-12-21 DIAGNOSIS — E876 Hypokalemia: Secondary | ICD-10-CM | POA: Insufficient documentation

## 2014-12-21 HISTORY — DX: Other pulmonary embolism without acute cor pulmonale: I26.99

## 2014-12-21 LAB — I-STAT CHEM 8, ED
BUN: 9 mg/dL (ref 6–20)
Calcium, Ion: 0.82 mmol/L — ABNORMAL LOW (ref 1.12–1.23)
Chloride: 116 mmol/L — ABNORMAL HIGH (ref 101–111)
Creatinine, Ser: 0.5 mg/dL (ref 0.44–1.00)
GLUCOSE: 71 mg/dL (ref 65–99)
HCT: 30 % — ABNORMAL LOW (ref 36.0–46.0)
HEMOGLOBIN: 10.2 g/dL — AB (ref 12.0–15.0)
Potassium: 2.7 mmol/L — CL (ref 3.5–5.1)
Sodium: 143 mmol/L (ref 135–145)
TCO2: 14 mmol/L (ref 0–100)

## 2014-12-21 MED ORDER — POTASSIUM CHLORIDE CRYS ER 20 MEQ PO TBCR
40.0000 meq | EXTENDED_RELEASE_TABLET | Freq: Once | ORAL | Status: AC
  Administered 2014-12-21: 40 meq via ORAL
  Filled 2014-12-21: qty 2

## 2014-12-21 MED ORDER — DIPHENHYDRAMINE HCL 50 MG/ML IJ SOLN
50.0000 mg | Freq: Once | INTRAMUSCULAR | Status: AC
  Administered 2014-12-21: 50 mg via INTRAVENOUS
  Filled 2014-12-21: qty 1

## 2014-12-21 MED ORDER — POTASSIUM CHLORIDE 10 MEQ/100ML IV SOLN
10.0000 meq | Freq: Once | INTRAVENOUS | Status: AC
  Administered 2014-12-21: 10 meq via INTRAVENOUS
  Filled 2014-12-21: qty 100

## 2014-12-21 MED ORDER — METOCLOPRAMIDE HCL 5 MG/ML IJ SOLN
10.0000 mg | Freq: Once | INTRAMUSCULAR | Status: AC
  Administered 2014-12-21: 10 mg via INTRAVENOUS
  Filled 2014-12-21: qty 2

## 2014-12-21 MED ORDER — ACETAMINOPHEN 500 MG PO TABS
1000.0000 mg | ORAL_TABLET | Freq: Once | ORAL | Status: AC
  Administered 2014-12-21: 1000 mg via ORAL
  Filled 2014-12-21: qty 2

## 2014-12-21 MED ORDER — SODIUM CHLORIDE 0.9 % IV BOLUS (SEPSIS)
500.0000 mL | Freq: Once | INTRAVENOUS | Status: AC
  Administered 2014-12-21: 500 mL via INTRAVENOUS

## 2014-12-21 MED ORDER — POTASSIUM CHLORIDE CRYS ER 20 MEQ PO TBCR: 20.0000 meq | EXTENDED_RELEASE_TABLET | Freq: Every day | ORAL | Status: DC

## 2014-12-21 NOTE — ED Notes (Signed)
Pt reports HA, nausea, blurred vision and dizziness since last night.  Pt started on xarelto on July 9th for a PE.  Also reports blood in her urine x 1 week-has been treated.  Pt has hx of migraines-reports that this HA feels different.

## 2014-12-21 NOTE — Discharge Instructions (Signed)
If you were given medicines take as directed.  If you are on coumadin or contraceptives realize their levels and effectiveness is altered by many different medicines.  If you have any reaction (rash, tongues swelling, other) to the medicines stop taking and see a physician.    If your blood pressure was elevated in the ER make sure you follow up for management with a primary doctor or return for chest pain, shortness of breath or stroke symptoms.  Please follow up as directed and return to the ER or see a physician for new or worsening symptoms.  Thank you. Filed Vitals:   12/21/14 1659 12/21/14 1856 12/21/14 2050  BP: 125/79 99/65 97/61   Pulse: 81 75 69  Temp: 97.8 F (36.6 C)    TempSrc: Oral    Resp: Height:  (1.676 m)    Weight: 200 lb (90.719 kg)    SpO2: 100% 98% 99%

## 2014-12-21 NOTE — ED Provider Notes (Addendum)
CSN: [id number]     Arrival date & time 12/21/14  1652 History  This chart was scribed for Blane Ohara, MD by Budd Palmer, ED Scribe. This patient was seen in room MH02/MH02 and the patient's care was started at 6:03 PM.    Chief Complaint  Patient presents with  . Headache   The history is provided by the patient. No language interpreter was used.   HPI Comments: Ellen Hood is a 44 y.o. female with a PMHx of Migraine and PE who presents to the Emergency Department complaining of a constant, worsening frontal HA onset 1 day ago. She reports associated nausea, blurry vision, photophobia, and dizziness. She states that this episode feels different from her previous migraines. She is on Topamax and Xarelto. Pt denies weakness or numbness of the extremities. She has NKDA.  Past Medical History  Diagnosis Date  . Migraine   . Endometriosis   . PE (pulmonary embolism)    Past Surgical History  Procedure Laterality Date  . Abdominal hysterectomy    . Cesarean section    . Hernia repair    . Laparoscopy     History reviewed. No pertinent family history. History  Substance Use Topics  . Smoking status: Never Smoker   . Smokeless tobacco: Not on file  . Alcohol Use: No   OB History    No data available     Review of Systems  Eyes: Positive for photophobia and visual disturbance.  Gastrointestinal: Positive for nausea.  Neurological: Positive for dizziness and headaches.  All other systems reviewed and are negative.   Allergies  Review of patient's allergies indicates no known allergies.  Home Medications   Prior to Admission medications   Medication Sig Start Date End Date Taking? Authorizing Provider  diphenhydrAMINE (BENADRYL) 25 MG tablet Take 25 mg by mouth at bedtime.      Historical Provider, MD  Multiple Vitamin (MULITIVITAMIN WITH MINERALS) TABS Take 1 tablet by mouth daily.      Historical Provider, MD  potassium chloride SA (K-DUR,KLOR-CON) 20 MEQ tablet  Take 1 tablet (20 mEq total) by mouth daily. 12/21/14   Blane Ohara, MD  Rivaroxaban (XARELTO) 15 MG TABS tablet Take 1 tablet (15 mg total) by mouth 2 (two) times daily with a meal. 11/29/14 12/18/14  Leroy Sea, MD  rivaroxaban (XARELTO) 20 MG TABS tablet Take 1 tablet (20 mg total) by mouth daily with supper. 12/19/14   Leroy Sea, MD  topiramate (TOPAMAX) 100 MG tablet Take 100 mg by mouth at bedtime.     Historical Provider, MD   BP 97/61 mmHg  Pulse 69  Temp(Src) 97.8 F (36.6 C) (Oral)  Resp 15  Ht 5\' 6"  (1.676 m)  Wt 200 lb (90.719 kg)  BMI 32.30 kg/m2  SpO2 99% Physical Exam  Constitutional: She is oriented to person, place, and time. She appears well-developed and well-nourished. No distress.  HENT:  Head: Normocephalic and atraumatic.  Mouth/Throat: Oropharynx is clear and moist.  Eyes: Conjunctivae and EOM are normal. Pupils are equal, round, and reactive to light.  Neck: Normal range of motion. Neck supple. No tracheal deviation present.  No meningismus on exam  Cardiovascular: Normal rate.   Pulmonary/Chest: Breath sounds normal. No respiratory distress.  Abdominal: Soft. There is no tenderness.  No focal abdominal tenderness  Musculoskeletal: Normal range of motion.  Neurological: She is alert and oriented to person, place, and time.  5+ strength in UE and LE with f/e at major  joints. Sensation to palpation intact in UE and LE. CNs 2-12 grossly intact.  EOMFI.  PERRL.   Finger nose and coordination intact bilateral.   Visual fields intact grossly   Skin: Skin is warm and dry.  Psychiatric: She has a normal mood and affect. Her behavior is normal.  Nursing note and vitals reviewed.   ED Course  Procedures  DIAGNOSTIC STUDIES: Oxygen Saturation is 100% on RA, normal by my interpretation.    COORDINATION OF CARE: 6:12 PM - Discussed plans to order diagnostic studies, IV fluids and migraine treatment. Pt advised of plan for treatment and pt  agrees.  Labs Review Labs Reviewed  I-STAT CHEM 8, ED - Abnormal; Notable for the following:    Potassium 2.7 (*)    Chloride 116 (*)    Calcium, Ion 0.82 (*)    Hemoglobin 10.2 (*)    HCT 30.0 (*)    All other components within normal limits    Imaging Review Ct Head Wo Contrast  12/21/2014   CLINICAL DATA:  Worsening dizziness since last night. Headache. Migraines. Blurry vision.  EXAM: CT HEAD WITHOUT CONTRAST  TECHNIQUE: Contiguous axial images were obtained from the base of the skull through the vertex without intravenous contrast.  COMPARISON:  09/19/2009.  FINDINGS: No mass lesion, mass effect, midline shift, hydrocephalus, hemorrhage. No territorial ischemia or acute infarction. Calvarium appears intact.  IMPRESSION: Negative CT head.   Electronically Signed   By: Andreas Newport M.D.   On: 12/21/2014 18:41     EKG Interpretation None      MDM   Final diagnoses:  Headache, unspecified headache type  Hypokalemia   Patient presented with headache overall similar to migraine history however was slight difference in patient on Xarelto for blood clots CT scan of the head was performed, CT results no bleeding, patient improved significantly on recheck, neuro exam unremarkable, follow-up outpatient. Results and differential diagnosis were discussed with the patient/parent/guardian. Xrays were independently reviewed by myself.  Close follow up outpatient was discussed, comfortable with the plan.   Medications  diphenhydrAMINE (BENADRYL) injection 50 mg (50 mg Intravenous Given 12/21/14 1821)  metoCLOPramide (REGLAN) injection 10 mg (10 mg Intravenous Given 12/21/14 1821)  sodium chloride 0.9 % bolus 500 mL (0 mLs Intravenous Stopped 12/21/14 1910)  potassium chloride 10 mEq in 100 mL IVPB (10 mEq Intravenous New Bag/Given 12/21/14 1948)  potassium chloride SA (K-DUR,KLOR-CON) CR tablet 40 mEq (40 mEq Oral Given 12/21/14 1948)  acetaminophen (TYLENOL) tablet 1,000 mg (1,000 mg Oral  Given 12/21/14 1948)    Filed Vitals:   12/21/14 1659 12/21/14 1856 12/21/14 2050  BP: 125/79 99/65 97/61   Pulse: 81 75 69  Temp: 97.8 F (36.6 C)    TempSrc: Oral    Resp: Height:  (1.676 m)    Weight: 200 lb (90.719 kg)    SpO2: 100% 98% 99%    Final diagnoses:  Headache, unspecified headache type  Hypokalemia      Blane Ohara, MD 12/21/14 2133  Blane Ohara, MD 12/21/14 2140

## 2014-12-22 ENCOUNTER — Telehealth: Payer: Self-pay | Admitting: Hematology & Oncology

## 2014-12-22 NOTE — Telephone Encounter (Signed)
Pt returned call to confirm new pt appt on 8/15

## 2015-01-05 ENCOUNTER — Ambulatory Visit: Payer: Medicaid Other

## 2015-01-05 ENCOUNTER — Other Ambulatory Visit (HOSPITAL_BASED_OUTPATIENT_CLINIC_OR_DEPARTMENT_OTHER): Payer: Medicaid Other

## 2015-01-05 ENCOUNTER — Ambulatory Visit (HOSPITAL_BASED_OUTPATIENT_CLINIC_OR_DEPARTMENT_OTHER): Payer: Medicaid Other | Admitting: Hematology & Oncology

## 2015-01-05 ENCOUNTER — Encounter: Payer: Self-pay | Admitting: Hematology & Oncology

## 2015-01-05 VITALS — BP 111/70 | HR 74 | Temp 98.2°F | Resp 16 | Ht 66.0 in | Wt 205.0 lb

## 2015-01-05 DIAGNOSIS — I2699 Other pulmonary embolism without acute cor pulmonale: Secondary | ICD-10-CM | POA: Diagnosis not present

## 2015-01-05 LAB — CBC WITH DIFFERENTIAL (CANCER CENTER ONLY)
BASO#: 0 10*3/uL (ref 0.0–0.2)
BASO%: 0.3 % (ref 0.0–2.0)
EOS ABS: 0.1 10*3/uL (ref 0.0–0.5)
EOS%: 1.8 % (ref 0.0–7.0)
HEMATOCRIT: 36.7 % (ref 34.8–46.6)
HGB: 12.1 g/dL (ref 11.6–15.9)
LYMPH#: 2.4 10*3/uL (ref 0.9–3.3)
LYMPH%: 40.7 % (ref 14.0–48.0)
MCH: 29.4 pg (ref 26.0–34.0)
MCHC: 33 g/dL (ref 32.0–36.0)
MCV: 89 fL (ref 81–101)
MONO#: 0.4 10*3/uL (ref 0.1–0.9)
MONO%: 6.4 % (ref 0.0–13.0)
NEUT%: 50.8 % (ref 39.6–80.0)
NEUTROS ABS: 3 10*3/uL (ref 1.5–6.5)
Platelets: 289 10*3/uL (ref 145–400)
RBC: 4.11 10*6/uL (ref 3.70–5.32)
RDW: 14.6 % (ref 11.1–15.7)
WBC: 6 10*3/uL (ref 3.9–10.0)

## 2015-01-05 LAB — CMP (CANCER CENTER ONLY)
ALBUMIN: 3.4 g/dL (ref 3.3–5.5)
ALT(SGPT): 23 U/L (ref 10–47)
AST: 19 U/L (ref 11–38)
Alkaline Phosphatase: 87 U/L — ABNORMAL HIGH (ref 26–84)
BUN, Bld: 14 mg/dL (ref 7–22)
CHLORIDE: 105 meq/L (ref 98–108)
CO2: 23 mEq/L (ref 18–33)
Calcium: 9.4 mg/dL (ref 8.0–10.3)
Creat: 0.9 mg/dl (ref 0.6–1.2)
Glucose, Bld: 88 mg/dL (ref 73–118)
Potassium: 3.6 mEq/L (ref 3.3–4.7)
Sodium: 137 mEq/L (ref 128–145)
Total Bilirubin: 0.5 mg/dl (ref 0.20–1.60)
Total Protein: 7.7 g/dL (ref 6.4–8.1)

## 2015-01-05 MED ORDER — METHYLPREDNISOLONE 4 MG PO TBPK: ORAL_TABLET | ORAL | Status: DC

## 2015-01-05 NOTE — Progress Notes (Signed)
Referral MD  Reason for Referral: Bilateral pulmonary embolism-idiopathic   Chief Complaint  Patient presents with  . OTHER  : I have a blood clot in my lung.  HPI: Ellen Hood is a very charming 44 year old African-American female. She really has no significant past medical history. 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  About a month or so ago, she went to the emergency room. She would have some shortness of breath for a few weeks. She had some increasing chest wall pain. She had no hemoptysis. She had a little bit of a cough. She had no fever.  She had not noted any leg swelling. There is no leg pain. She underwent a CT angiogram in the emergency room on July 7. This, shockingly, showed extensive pulmonary embolus area and she had embolus in the right main pulmonary artery extending into the upper, middle and lower branches. Also noted was embolus in the left side. There is some right heart strain. Plan she had an echocardiogram done. This showed an ejection fraction of 55 5 and 60%. Everything looked okay with the echocardiogram.  She started on anticoagulation. She had a hypercoagulable profile done. This was all benign. No hereditary factor was noted that could've caused the embolus.  She had Dopplers of her legs. These were negative for any thrombus.  She subsequently was discharged on Xarelto.  Of note, a year ago, a younger sister also had pulmonary embolus. This was in [place].  I think she said that a grandmother also had blood clots.  Ellen Hood has not been on oral contraceptives. She is not traveled recently. She does not smoke. She is not diabetic.  She was, I referred to the [facility] for evaluation.  She feels well. She has dry cough. She has some chest wall discomfort. She does not have hemoptysis.  Is no change in bowel or bladder habits.  She is getting her mammograms  routinely.  She had a hysterectomy about 14 years ago for endometriosis. She had not been on any estrogens.  Overall, her performance status is ECOG 0.                        Past Medical History  Diagnosis Date  . Migraine   . Endometriosis   . PE (pulmonary embolism)   :  Past Surgical History  Procedure Laterality Date  . Abdominal hysterectomy    . Cesarean section    . Hernia repair    . Laparoscopy    :   Current outpatient prescriptions:  .  albuterol (PROVENTIL HFA;VENTOLIN HFA) 108 (90 BASE) MCG/ACT inhaler, Inhale into the lungs., Disp: , Rfl:  .  diphenhydrAMINE (BENADRYL) 25 MG tablet, Take 25 mg by mouth at bedtime.  , Disp: , Rfl:  .  Multiple Vitamin (MULITIVITAMIN WITH MINERALS) TABS, Take 1 tablet by mouth daily.  , Disp: , Rfl:  .  rivaroxaban (XARELTO) 20 MG TABS tablet, Take 1 tablet (20 mg total) by mouth daily with supper., Disp: 30 tablet, Rfl: 1 .  tiZANidine (ZANAFLEX) 2 MG tablet, TAKE 1 TO 2 TABLETS BY MOUTH EVERY 6 HOURS AS NEEDED, Disp: , Rfl:  .  topiramate (TOPAMAX) 100 MG tablet, Take 100 mg by mouth at bedtime. , Disp: , Rfl:  .  methylPREDNISolone (MEDROL DOSEPAK) 4 MG TBPK tablet, Take as directed, Disp: 21 tablet, Rfl: 0 .  Rivaroxaban (XARELTO) 15 MG TABS tablet, Take 1 tablet (15 mg total) by mouth 2 (two) times daily with a meal., Disp: 40 tablet, Rfl: 0:  :  No Known Allergies:  No family history on file.:  Social History   Social History  . Marital Status: Divorced    Spouse Name: N/A  . Number of Children: N/A  . Years of Education: N/A   Occupational History  . Not on file.   Social History Main Topics  . Smoking status: Never Smoker   . Smokeless tobacco: Not on file  . Alcohol Use: No  . Drug Use: No  . Sexual Activity: Yes    Birth Control/ Protection: Surgical   Other Topics Concern  . Not on file   Social History Narrative  :  Pertinent items are noted in HPI.  Exam: @IPVITALS @   well-developed and well-nourished African-American female in no obvious distress. Vital signs show temperature of 98.2. Pulse 74. Blood pressure 111/70. Weight is 205 pounds. Head and neck exam shows no ocular or oral lesions. There are no palpable cervical or supraclavicular lymph nodes. Lungs are clear bilaterally. She has no rales, wheezes or rhonchi. Cardiac exam regular rate and rhythm with a normal S1 and S2. There are no murmurs rubs or bruits. Abdomen is soft. She has good bowel sounds. There is no fluid wave. There is no palpable liver or spleen tip. Back exam shows no tenderness over the spine, ribs or hips. Extremities shows no clubbing, cyanosis or edema. No venous cord is noted in her legs. She has good range of motion of her joints. Skin exam shows no rashes, ecchymoses or petechia. Neurological exam shows no focal neurological deficits.    Recent Labs  01/05/15 1311  WBC 6.0  HGB 12.1  HCT 36.7  PLT 289    Recent Labs  01/05/15 1312  NA 137  K 3.6  CL 105  CO2 23  GLUCOSE 88  BUN 14  CREATININE 0.9  CALCIUM 9.4    Blood smear review: None  Pathology: None     Assessment and Plan:  Ellen Hood is a 44 year old African American female. She has had an etiopathic pulmonary embolus. Surprisingly enough, her lower extremity Dopplers were negative.  She had a fairly large burden of embolus.  I believe that she will need at least a year of anticoagulation. She is on Xarelto right now. She is doing well with Xarelto.  I don't see that we need to do any other studies on her. There is nothing that would suggest an underlying malignancy.  Of note, she does not have sickle cell disease.  I spoke to her for about 45 minutes. I explained to her why I thought she needed one year of anticoagulation.  We see her back in 2 months, I will repeat a CT angiogram of her chest so that we can see how the embolus is responding.  She is very interesting. I did go ahead and give her a  prescription for a Medrol dose pack to try to help with some of this dry cough that she has. I suspect he probably is a little bit of bronchial irritation from where she sustained an  embolus and possibly a little bit of a pulmonary infarct.

## 2015-02-17 ENCOUNTER — Other Ambulatory Visit: Payer: Self-pay | Admitting: *Deleted

## 2015-02-17 DIAGNOSIS — I2699 Other pulmonary embolism without acute cor pulmonale: Secondary | ICD-10-CM

## 2015-02-17 MED ORDER — RIVAROXABAN 20 MG PO TABS: 20.0000 mg | ORAL_TABLET | Freq: Every day | ORAL | Status: DC

## 2015-03-06 ENCOUNTER — Other Ambulatory Visit (HOSPITAL_BASED_OUTPATIENT_CLINIC_OR_DEPARTMENT_OTHER): Payer: Medicaid Other

## 2015-03-06 ENCOUNTER — Encounter (HOSPITAL_BASED_OUTPATIENT_CLINIC_OR_DEPARTMENT_OTHER): Payer: Self-pay

## 2015-03-06 ENCOUNTER — Ambulatory Visit (HOSPITAL_BASED_OUTPATIENT_CLINIC_OR_DEPARTMENT_OTHER): Payer: Medicaid Other | Admitting: Family

## 2015-03-06 ENCOUNTER — Encounter: Payer: Self-pay | Admitting: Hematology & Oncology

## 2015-03-06 ENCOUNTER — Ambulatory Visit (HOSPITAL_BASED_OUTPATIENT_CLINIC_OR_DEPARTMENT_OTHER)
Admission: RE | Admit: 2015-03-06 | Discharge: 2015-03-06 | Disposition: A | Discharge status: Home / Self Care | Payer: Medicaid Other | Source: Ambulatory Visit | Attending: Hematology & Oncology | Admitting: Hematology & Oncology

## 2015-03-06 VITALS — BP 123/75 | HR 74 | Temp 97.5°F | Resp 16 | Ht 66.0 in | Wt 207.0 lb

## 2015-03-06 DIAGNOSIS — I2699 Other pulmonary embolism without acute cor pulmonale: Secondary | ICD-10-CM

## 2015-03-06 DIAGNOSIS — Z86711 Personal history of pulmonary embolism: Secondary | ICD-10-CM | POA: Diagnosis present

## 2015-03-06 DIAGNOSIS — R911 Solitary pulmonary nodule: Secondary | ICD-10-CM | POA: Insufficient documentation

## 2015-03-06 LAB — COMPREHENSIVE METABOLIC PANEL
ALT: 17 U/L (ref 6–29)
AST: 19 U/L (ref 10–30)
Albumin: 3.6 g/dL (ref 3.6–5.1)
Alkaline Phosphatase: 92 U/L (ref 33–115)
BUN: 16 mg/dL (ref 7–25)
CO2: 21 mmol/L (ref 20–31)
CREATININE: 0.81 mg/dL (ref 0.50–1.10)
Calcium: 8.8 mg/dL (ref 8.6–10.2)
Chloride: 106 mmol/L (ref 98–110)
Glucose, Bld: 74 mg/dL (ref 65–99)
Potassium: 4.2 mmol/L (ref 3.5–5.3)
SODIUM: 137 mmol/L (ref 135–146)
TOTAL PROTEIN: 7.6 g/dL (ref 6.1–8.1)
Total Bilirubin: 0.3 mg/dL (ref 0.2–1.2)

## 2015-03-06 LAB — CBC WITH DIFFERENTIAL (CANCER CENTER ONLY)
BASO#: 0 10*3/uL (ref 0.0–0.2)
BASO%: 0.5 % (ref 0.0–2.0)
EOS%: 1 % (ref 0.0–7.0)
Eosinophils Absolute: 0.1 10*3/uL (ref 0.0–0.5)
HCT: 38 % (ref 34.8–46.6)
HEMOGLOBIN: 12.5 g/dL (ref 11.6–15.9)
LYMPH#: 3.2 10*3/uL (ref 0.9–3.3)
LYMPH%: 40.5 % (ref 14.0–48.0)
MCH: 28.8 pg (ref 26.0–34.0)
MCHC: 32.9 g/dL (ref 32.0–36.0)
MCV: 88 fL (ref 81–101)
MONO#: 0.7 10*3/uL (ref 0.1–0.9)
MONO%: 8.6 % (ref 0.0–13.0)
NEUT%: 49.4 % (ref 39.6–80.0)
NEUTROS ABS: 3.9 10*3/uL (ref 1.5–6.5)
Platelets: 291 10*3/uL (ref 145–400)
RBC: 4.34 10*6/uL (ref 3.70–5.32)
RDW: 13.9 % (ref 11.1–15.7)
WBC: 8 10*3/uL (ref 3.9–10.0)

## 2015-03-06 MED ORDER — IOHEXOL 350 MG/ML SOLN
100.0000 mL | Freq: Once | INTRAVENOUS | Status: AC | PRN
  Administered 2015-03-06: 100 mL via INTRAVENOUS

## 2015-03-06 NOTE — Progress Notes (Signed)
Hematology and Oncology Follow Up Visit  Ellen Hood [id number] 27-Mar-1971 44 y.o. 03/06/2015   Principle Diagnosis:  Idiopathic bilateral pulmonary emboli  Current Therapy:   Xarelto 20 mg PO daily     Interim History:  [name] is here today for a follow-up. She has a significant family history for blood clots but her hypercoag panel was negative. This was her first experience.  She is doing well and has no complaints at this time. Her CT angio today showed resolution of pulmonary emboli. She has done well on Xarelto and had no more episodes of bleeding.  She denies fever, chills, n/v, cough, rash, dizziness, SOB, chest pain, palpitations, abdominal pain or changes in bowel or bladder habits.  She has had Migraines for years and takes Topamax daily. She states that at times she will have numbness and tingling in her hands and feet from a migraine.  No swelling or tenderness in her extremities.  She is eating well and drinking lots of fluids. Her weight is stable.   Medications:    Medication List       This list is accurate as of: 03/06/15  4:06 PM.  Always use your most recent med list.               albuterol 108 (90 BASE) MCG/ACT inhaler  Commonly known as:  PROVENTIL HFA;VENTOLIN HFA  Inhale into the lungs.     diphenhydrAMINE 25 MG tablet  Commonly known as:  BENADRYL  Take 25 mg by mouth at bedtime.     Rivaroxaban 15 MG Tabs tablet  Commonly known as:  XARELTO  Take 1 tablet (15 mg total) by mouth 2 (two) times daily with a meal.     rivaroxaban 20 MG Tabs tablet  Commonly known as:  XARELTO  Take 1 tablet (20 mg total) by mouth daily with supper.     tiZANidine 2 MG tablet  Commonly known as:  ZANAFLEX  TAKE 1 TO 2 TABLETS BY MOUTH EVERY 6 HOURS AS NEEDED     topiramate 100 MG tablet  Commonly known as:  TOPAMAX  Take 100 mg by mouth at bedtime.        Allergies: No Known Allergies  Past Medical History, Surgical history, Social history,  and Family History were reviewed and updated.  Review of Systems: All other 10 point review of systems is negative.   Physical Exam:  height is  (1.676 m) and weight is 207 lb (93.895 kg). Her oral temperature is 97.5 F (36.4 C). Her blood pressure is 123/75 and her pulse is 74. Her respiration is 16.   Wt Readings from Last 3 Encounters:  03/06/15 207 lb (93.895 kg)  01/05/15 205 lb (92.987 kg)  12/21/14 200 lb (90.719 kg)    Ocular: Sclerae unicteric, pupils equal, round and reactive to light Ear-nose-throat: Oropharynx clear, dentition fair Lymphatic: No cervical or supraclavicular adenopathy Lungs no rales or rhonchi, good excursion bilaterally Heart regular rate and rhythm, no murmur appreciated Abd soft, nontender, positive bowel sounds MSK no focal spinal tenderness, no joint edema Neuro: non-focal, well-oriented, appropriate affect Breasts: Deferred  Lab Results  Component Value Date   WBC 8.0 03/06/2015   HGB 12.5 03/06/2015   HCT 38.0 03/06/2015   MCV 88 03/06/2015   PLT 291 03/06/2015   No results found for: FERRITIN, IRON, TIBC, UIBC, IRONPCTSAT Lab Results  Component Value Date   RBC 4.34 03/06/2015   No results found for: KPAFRELGTCHN, LAMBDASER, KAPLAMBRATIO  No results found for: IGGSERUM, IGA, IGMSERUM No results found for: Marda StalkerOTALPROTELP, ALBUMINELP, A1GS, A2GS, BETS, BETA2SER, GAMS, MSPIKE, SPEI   Chemistry      Component Value Date/Time   NA 137 01/05/2015 1312   NA 143 12/21/2014 1857   K 3.6 01/05/2015 1312   K 2.7* 12/21/2014 1857   CL 105 01/05/2015 1312   CL 116* 12/21/2014 1857   CO2 23 01/05/2015 1312   CO2 24 12/09/2014 2000   BUN 14 01/05/2015 1312   BUN 9 12/21/2014 1857   CREATININE 0.9 01/05/2015 1312   CREATININE 0.50 12/21/2014 1857      Component Value Date/Time   CALCIUM 9.4 01/05/2015 1312   CALCIUM 8.9 12/09/2014 2000   ALKPHOS 87* 01/05/2015 1312   ALKPHOS 121* 05/02/2013 1920   AST 19 01/05/2015 1312   AST 17  05/02/2013 1920   ALT 23 01/05/2015 1312   ALT 20 05/02/2013 1920   BILITOT 0.50 01/05/2015 1312   BILITOT 0.3 05/02/2013 1920     Impression and Plan: [name] is a [age] African American female with idiopathic bilateral pulmonary emboli. Ct angio today showed complete resolution of pulmonary emboli. She is doing well on Xarelto and is asymptomatic at this time. No more episodes of bleeding.  She will continue on her some dose of Xarelto with the goal of completing 1 year of therapy.  We will plan to see her back in 3 months for labs and follow-up.  She knows to contact us with any questions or concerns. We can certainly see her sooner if need be.   Verdie MosherINCINNATI,SARAH M, NP 10/14/20164:06 PM

## 2015-06-12 ENCOUNTER — Other Ambulatory Visit (HOSPITAL_BASED_OUTPATIENT_CLINIC_OR_DEPARTMENT_OTHER): Payer: Medicaid Other

## 2015-06-12 ENCOUNTER — Encounter: Payer: Self-pay | Admitting: Family

## 2015-06-12 ENCOUNTER — Ambulatory Visit (HOSPITAL_BASED_OUTPATIENT_CLINIC_OR_DEPARTMENT_OTHER): Payer: Medicaid Other | Admitting: Family

## 2015-06-12 VITALS — BP 111/77 | HR 66 | Temp 98.3°F | Resp 16 | Ht 66.0 in | Wt 203.0 lb

## 2015-06-12 DIAGNOSIS — I2699 Other pulmonary embolism without acute cor pulmonale: Secondary | ICD-10-CM

## 2015-06-12 DIAGNOSIS — Z7901 Long term (current) use of anticoagulants: Secondary | ICD-10-CM

## 2015-06-12 DIAGNOSIS — Z86711 Personal history of pulmonary embolism: Secondary | ICD-10-CM

## 2015-06-12 LAB — CBC WITH DIFFERENTIAL (CANCER CENTER ONLY)
BASO#: 0 10*3/uL (ref 0.0–0.2)
BASO%: 0.3 % (ref 0.0–2.0)
EOS ABS: 0.1 10*3/uL (ref 0.0–0.5)
EOS%: 1.2 % (ref 0.0–7.0)
HEMATOCRIT: 39.6 % (ref 34.8–46.6)
HGB: 12.7 g/dL (ref 11.6–15.9)
LYMPH#: 2.8 10*3/uL (ref 0.9–3.3)
LYMPH%: 43.8 % (ref 14.0–48.0)
MCH: 28.5 pg (ref 26.0–34.0)
MCHC: 32.1 g/dL (ref 32.0–36.0)
MCV: 89 fL (ref 81–101)
MONO#: 0.3 10*3/uL (ref 0.1–0.9)
MONO%: 5.3 % (ref 0.0–13.0)
NEUT#: 3.2 10*3/uL (ref 1.5–6.5)
NEUT%: 49.4 % (ref 39.6–80.0)
PLATELETS: 259 10*3/uL (ref 145–400)
RBC: 4.45 10*6/uL (ref 3.70–5.32)
RDW: 14.8 % (ref 11.1–15.7)
WBC: 6.4 10*3/uL (ref 3.9–10.0)

## 2015-06-12 NOTE — Progress Notes (Signed)
Hematology and Oncology Follow Up Visit  Ellen Hood [id number] 1971-01-24 44 y.o. 06/12/2015   Principle Diagnosis:  Pulmonary emboli (idiopathic) - resolved    Current Therapy:   Xarelto 20 mg PO daily     Interim History:  Ellen Hood is here today for a follow-up. She is doing fairly well. 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 She has also had some "brain fog" with her Topamax. She is followed by Neurology and is in the process of having her medication adjusted.  No chest pain, palpitations, cough, SOB, rash, dizziness, abdominal pain or changes in bowel or bladder habits.  No lymphadenopathy found on exam.  No bleeding or bruising while on Xarelto.  She is staying well hydrated. She feels that her appetite is decreased. Her weight is stable.  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   Medications:    Medication List       This list is accurate as of: 06/12/15  2:05 PM.  Always use your most recent med list.               albuterol 108 (90 Base) MCG/ACT inhaler  Commonly known as:  PROVENTIL HFA;VENTOLIN HFA  Inhale into the lungs.     diphenhydrAMINE 25 MG tablet  Commonly known as:  BENADRYL  Take 25 mg by mouth at bedtime.     Rivaroxaban 15 MG Tabs tablet  Commonly known as:  XARELTO  Take 1 tablet (15 mg total) by mouth 2 (two) times daily with a meal.     rivaroxaban 20 MG Tabs tablet  Commonly known as:  XARELTO  Take 1 tablet (20 mg total) by mouth daily with supper.     tiZANidine 2 MG tablet  Commonly known as:  ZANAFLEX  TAKE 1 TO 2 TABLETS BY MOUTH EVERY 6 HOURS AS NEEDED     topiramate 100 MG tablet  Commonly known as:  TOPAMAX  Take 100 mg by mouth at bedtime.        Allergies: No Known Allergies  Past Medical History, Surgical history, Social history, and  Family History were reviewed and updated.  Review of Systems: All other 10 point review of systems is negative.   Physical Exam:  vitals were not taken for this visit.  Wt Readings from Last 3 Encounters:  03/06/15 207 lb (93.895 kg)  01/05/15 205 lb (92.987 kg)  12/21/14 200 lb (90.719 kg)    Ocular: Sclerae unicteric, pupils equal, round and reactive to light Ear-nose-throat: Oropharynx clear, dentition fair Lymphatic: No cervical supraclavicular or axillary adenopathy Lungs no rales or rhonchi, good excursion bilaterally Heart regular rate and rhythm, no murmur appreciated Abd soft, nontender, positive bowel sounds, no liver or spleen tip palpated on exam MSK no focal spinal tenderness, no joint edema Neuro: non-focal, well-oriented, appropriate affect Breasts: Deferred  Lab Results  Component Value Date   WBC 8.0 03/06/2015   HGB 12.5 03/06/2015   HCT 38.0 03/06/2015   MCV 88 03/06/2015   PLT 291 03/06/2015   No results found for: FERRITIN, IRON, TIBC, UIBC, IRONPCTSAT Lab Results  Component Value Date   RBC 4.34 03/06/2015   No results found for: KPAFRELGTCHN, LAMBDASER, KAPLAMBRATIO No results found for: IGGSERUM, IGA, IGMSERUM No results found for: TOTALPROTELP, ALBUMINELP, A1GS, A2GS, BETS, BETA2SER, GAMS, MSPIKE, SPEI  Chemistry      Component Value Date/Time   NA 137 03/06/2015 1520   NA 137 01/05/2015 1312   K 4.2 03/06/2015 1520   K 3.6 01/05/2015 1312   CL 106 03/06/2015 1520   CL 105 01/05/2015 1312   CO2 21 03/06/2015 1520   CO2 23 01/05/2015 1312   BUN 16 03/06/2015 1520   BUN 14 01/05/2015 1312   CREATININE 0.81 03/06/2015 1520   CREATININE 0.9 01/05/2015 1312      Component Value Date/Time   CALCIUM 8.8 03/06/2015 1520   CALCIUM 9.4 01/05/2015 1312   ALKPHOS 92 03/06/2015 1520   ALKPHOS 87* 01/05/2015 1312   AST 19 03/06/2015 1520   AST 19 01/05/2015 1312   ALT 17 03/06/2015 1520   ALT 23 01/05/2015 1312   BILITOT 0.3 03/06/2015 1520    BILITOT 0.50 01/05/2015 1312     Impression and Plan: Ellen Hood is a 41 African American female with history of idiopathic bilateral pulmonary emboli. Her CT angio in December showed complete resolution of her pulmonary emboli. She continues to do well on Xarelto and is asymptomatic at this time.  She will complete 1 year of anticoagulation in August 2017.  We will plan to see her back in 4 months for labs and follow-up.  She knows to contact us with any questions or concerns. We can certainly see her sooner if need be.   Verdie Mosher, NP 1/20/20172:05 PM

## 2015-06-13 LAB — COMPREHENSIVE METABOLIC PANEL (CC13)
ALT: 19 IU/L (ref 0–32)
AST (SGOT): 15 IU/L (ref 0–40)
Albumin, Serum: 3.9 g/dL (ref 3.5–5.5)
Albumin/Globulin Ratio: 0.9 — ABNORMAL LOW (ref 1.1–2.5)
Alkaline Phosphatase, S: 119 IU/L — ABNORMAL HIGH (ref 39–117)
BUN/Creatinine Ratio: 15 (ref 9–23)
BUN: 13 mg/dL (ref 6–24)
Bilirubin Total: 0.3 mg/dL (ref 0.0–1.2)
CALCIUM: 9.6 mg/dL (ref 8.7–10.2)
CO2: 23 mmol/L (ref 18–29)
Chloride, Ser: 107 mmol/L — ABNORMAL HIGH (ref 96–106)
Creatinine, Ser: 0.85 mg/dL (ref 0.57–1.00)
GFR, EST AFRICAN AMERICAN: 96 mL/min/{1.73_m2} (ref >59)
GFR, EST NON AFRICAN AMERICAN: 84 mL/min/{1.73_m2} (ref >59)
GLUCOSE: 94 mg/dL (ref 65–99)
Globulin, Total: 4.3 g/dL (ref 1.5–4.5)
Potassium, Ser: 4.4 mmol/L (ref 3.5–5.2)
Sodium: 143 mmol/L (ref 134–144)
TOTAL PROTEIN: 8.2 g/dL (ref 6.0–8.5)

## 2015-06-24 ENCOUNTER — Other Ambulatory Visit: Payer: Self-pay | Admitting: Hematology & Oncology

## 2015-07-23 ENCOUNTER — Other Ambulatory Visit: Payer: Self-pay | Admitting: Hematology & Oncology

## 2015-09-30 ENCOUNTER — Encounter (HOSPITAL_BASED_OUTPATIENT_CLINIC_OR_DEPARTMENT_OTHER): Payer: Self-pay

## 2015-09-30 ENCOUNTER — Emergency Department (HOSPITAL_BASED_OUTPATIENT_CLINIC_OR_DEPARTMENT_OTHER)
Admission: EM | Admit: 2015-09-30 | Discharge: 2015-09-30 | Disposition: A | Discharge status: Home / Self Care | Payer: Medicaid Other | Attending: Emergency Medicine | Admitting: Emergency Medicine

## 2015-09-30 ENCOUNTER — Other Ambulatory Visit: Payer: Self-pay

## 2015-09-30 DIAGNOSIS — M5412 Radiculopathy, cervical region: Secondary | ICD-10-CM

## 2015-09-30 MED ORDER — HYDROCODONE-ACETAMINOPHEN 5-325 MG PO TABS: 1.0000 | ORAL_TABLET | Freq: Four times a day (QID) | ORAL | Status: DC | PRN

## 2015-09-30 NOTE — ED Provider Notes (Signed)
CSN: [id number]     Arrival date & time 09/30/15  2121 History  By signing my name below, I, Placido SouLogan Joldersma, attest that this documentation has been prepared under the direction and in the presence of Paula LibraJohn Kyia Rhude, MD. Electronically Signed: Placido SouLogan Joldersma, ED Scribe. 09/30/2015. 11:24 PM.   Chief Complaint  Patient presents with  . Arm Pain   The history is provided by the patient. No language interpreter was used.    HPI Comments: Ellen Hood is a 45 y.o. female who presents to the Emergency Department complaining of constant, 10/10, left arm pain for the past 3 weeks. She states it began with left upper chest pain 3 weeks ago and initially thought it was her bra. After changing bras it went away for a short period of time before returning along with pain in her left shoulder radiating down her LUE about the C7 dermatome. Her pain worsens when she turns her neck to the right or anteriorly. She has taken Extra Strength Tylenol without significant relief. Pt is taking Xarelto due to a PE in July of 2016. She is not having weakness of the LUE.  PCP: Dr. Malena Peerobert Crawford   Past Medical History  Diagnosis Date  . Migraine   . Endometriosis   . PE (pulmonary embolism)    Past Surgical History  Procedure Laterality Date  . Abdominal hysterectomy    . Cesarean section    . Hernia repair    . Laparoscopy     No family history on file. Social History  Substance Use Topics  . Smoking status: Never Smoker   . Smokeless tobacco: None  . Alcohol Use: No   OB History    No data available     Review of Systems  All other systems reviewed and are negative.  Allergies  Review of patient's allergies indicates no known allergies.  Home Medications   Prior to Admission medications   Medication Sig Start Date End Date Taking? Authorizing Provider  albuterol (PROVENTIL HFA;VENTOLIN HFA) 108 (90 BASE) MCG/ACT inhaler Inhale into the lungs. 11/30/14 11/30/15  Historical Provider, MD   diphenhydrAMINE (BENADRYL) 25 MG tablet Take 25 mg by mouth at bedtime.      Historical Provider, MD  HYDROcodone-acetaminophen (NORCO) 5-325 MG tablet Take 1-2 tablets by mouth every 6 (six) hours as needed (for pain). 09/30/15   Reham Slabaugh, MD  tiZANidine (ZANAFLEX) 2 MG tablet TAKE 1 TO 2 TABLETS BY MOUTH EVERY 6 HOURS AS NEEDED 01/01/15   Historical Provider, MD  Topiramate ER (TROKENDI XR) 100 MG CP24 Take 100 capsules by mouth daily.    Historical Provider, MD  XARELTO 20 MG TABS tablet TAKE 1 TABLET(20 MG) BY MOUTH DAILY WITH DINNER 07/24/15   Josph MachoPeter R Ennever, MD   BP 103/78 mmHg  Pulse 72  Temp(Src) 98.5 F (36.9 C) (Oral)  Resp 16  Ht 5\' 6"  (1.676 m)  Wt 192 lb (87.091 kg)  BMI 31.00 kg/m2  SpO2 100%   Physical Exam General: Well-developed, well-nourished female in no acute distress; appearance consistent with age of record HENT: normocephalic; atraumatic Eyes: pupils equal, round and reactive to light; extraocular muscles intact Neck: supple; rotation of the head to the right or anterior flexion exacerbates pain  Heart: regular rate and rhythm Lungs: clear to auscultation bilaterally Abdomen: soft; nondistended Extremities: No deformity; full range of motion; pulses normal Neurologic: Awake, alert and oriented; motor function intact in all extremities and symmetric; no facial droop Skin: Warm and dry  Psychiatric: Normal mood and affect  ED Course  Procedures  DIAGNOSTIC STUDIES: Oxygen Saturation is 99% on RA, normal by my interpretation.    COORDINATION OF CARE: 11:22 PM Discussed next steps with pt. She verbalized understanding and is agreeable with the plan.    EKG Interpretation   Date/Time:  Wednesday Sep 30 2015 21:44:58 EDT Ventricular Rate:  76 PR Interval:  132 QRS Duration: 90 QT Interval:  394 QTC Calculation: 443 R Axis:   40 Text Interpretation:  Normal sinus rhythm Low voltage QRS Nonspecific T  wave abnormality Abnormal ECG No significant change  was found Confirmed by  Fawn Desrocher  MD, Jonny Ruiz ([id number]) on 09/30/2015 10:55:41 PM      MDM   Final diagnoses:  Cervical radiculopathy at C7   I personally performed the services described in this documentation, which was scribed in my presence. The recorded information has been reviewed and is accurate.    Paula Libra, MD 09/30/15 [phone]

## 2015-09-30 NOTE — ED Notes (Signed)
Pt verbalizes understanding of d/c instructions and denies any further needs at this time. 

## 2015-09-30 NOTE — Discharge Instructions (Signed)

## 2015-09-30 NOTE — ED Notes (Addendum)
C/o tingling to left arm x 3 weeks-intermittent CP during the last 3 weeks-denies at present-denies injury-NAD-steady gait

## 2015-10-14 ENCOUNTER — Ambulatory Visit: Payer: Medicaid Other | Admitting: Family

## 2015-10-14 ENCOUNTER — Telehealth: Payer: Self-pay | Admitting: *Deleted

## 2015-10-14 ENCOUNTER — Other Ambulatory Visit: Payer: Medicaid Other

## 2015-10-14 NOTE — Telephone Encounter (Signed)
Patient is prescribed Xarelto. She doesn't have adequate insurance coverage and can't afford the co-pay. She has been in touch with a financial aid company and they are to fax the office with forms for us to fill out. This office has not yet received these forms. Fax number given to patient to ensure they are being sent to the right place.  Until aid is processed, the office can provide patient with 28 days worth of samples of Xarelto. She will pick these up this afternoon.

## 2015-10-20 ENCOUNTER — Encounter (HOSPITAL_BASED_OUTPATIENT_CLINIC_OR_DEPARTMENT_OTHER): Payer: Self-pay

## 2015-10-20 ENCOUNTER — Emergency Department (HOSPITAL_BASED_OUTPATIENT_CLINIC_OR_DEPARTMENT_OTHER)
Admission: EM | Admit: 2015-10-20 | Discharge: 2015-10-20 | Disposition: A | Discharge status: Home / Self Care | Payer: Medicaid Other | Attending: Emergency Medicine | Admitting: Emergency Medicine

## 2015-10-20 DIAGNOSIS — M545 Low back pain, unspecified: Secondary | ICD-10-CM

## 2015-10-20 LAB — URINALYSIS, ROUTINE W REFLEX MICROSCOPIC
Bilirubin Urine: NEGATIVE
Glucose, UA: NEGATIVE mg/dL
Hgb urine dipstick: NEGATIVE
Ketones, ur: NEGATIVE mg/dL
Leukocytes, UA: NEGATIVE
Nitrite: NEGATIVE
Protein, ur: NEGATIVE mg/dL
Specific Gravity, Urine: 1.029 (ref 1.005–1.030)
pH: 5 (ref 5.0–8.0)

## 2015-10-20 NOTE — ED Notes (Signed)
PA at bedside for assessment

## 2015-10-20 NOTE — Discharge Instructions (Signed)
Back Exercises °The following exercises strengthen the muscles that help to support the back. They also help to keep the lower back flexible. Doing these exercises can help to prevent back pain or lessen existing pain. °If you have back pain or discomfort, try doing these exercises 2-3 times each day or as told by your health care provider. When the pain goes away, do them once each day, but increase the number of times that you repeat the steps for each exercise (do more repetitions). If you do not have back pain or discomfort, do these exercises once each day or as told by your health care provider. °EXERCISES °Single Knee to Chest °Repeat these steps 3-5 times for each leg: °· Lie on your back on a firm bed or the floor with your legs extended. °· Bring one knee to your chest. Your other leg should stay extended and in contact with the floor. °· Hold your knee in place by grabbing your knee or thigh. °· Pull on your knee until you feel a gentle stretch in your lower back. °· Hold the stretch for 10-30 seconds. °· Slowly release and straighten your leg. °Pelvic Tilt °Repeat these steps 5-10 times: °· Lie on your back on a firm bed or the floor with your legs extended. °· Bend your knees so they are pointing toward the ceiling and your feet are flat on the floor. °· Tighten your lower abdominal muscles to press your lower back against the floor. This motion will tilt your pelvis so your tailbone points up toward the ceiling instead of pointing to your feet or the floor. °· With gentle tension and even breathing, hold this position for 5-10 seconds. °Cat-Cow °Repeat these steps until your lower back becomes more flexible: °· Get into a hands-and-knees position on a firm surface. Keep your hands under your shoulders, and keep your knees under your hips. You may place padding under your knees for comfort. °· Let your head hang down, and point your tailbone toward the floor so your lower back becomes rounded like the  back of a cat. °· Hold this position for 5 seconds. °· Slowly lift your head and point your tailbone up toward the ceiling so your back forms a sagging arch like the back of a cow. °· Hold this position for 5 seconds. °Press-Ups °Repeat these steps 5-10 times: °· Lie on your abdomen (face-down) on the floor. °· Place your palms near your head, about shoulder-width apart. °· While you keep your back as relaxed as possible and keep your hips on the floor, slowly straighten your arms to raise the top half of your body and lift your shoulders. Do not use your back muscles to raise your upper torso. You may adjust the placement of your hands to make yourself more comfortable. °· Hold this position for 5 seconds while you keep your back relaxed. °· Slowly return to lying flat on the floor. °Bridges °Repeat these steps 10 times: °1. Lie on your back on a firm surface. °2. Bend your knees so they are pointing toward the ceiling and your feet are flat on the floor. °3. Tighten your buttocks muscles and lift your buttocks off of the floor until your waist is at almost the same height as your knees. You should feel the muscles working in your buttocks and the back of your thighs. If you do not feel these muscles, slide your feet 1-2 inches farther away from your buttocks. °4. Hold this position for 3-5   seconds. °5. Slowly lower your hips to the starting position, and allow your buttocks muscles to relax completely. °If this exercise is too easy, try doing it with your arms crossed over your chest. °Abdominal Crunches °Repeat these steps 5-10 times: °1. Lie on your back on a firm bed or the floor with your legs extended. °2. Bend your knees so they are pointing toward the ceiling and your feet are flat on the floor. °3. Cross your arms over your chest. °4. Tip your chin slightly toward your chest without bending your neck. °5. Tighten your abdominal muscles and slowly raise your trunk (torso) high enough to lift your shoulder  blades a tiny bit off of the floor. Avoid raising your torso higher than that, because it can put too much stress on your low back and it does not help to strengthen your abdominal muscles. °6. Slowly return to your starting position. °Back Lifts °Repeat these steps 5-10 times: °1. Lie on your abdomen (face-down) with your arms at your sides, and rest your forehead on the floor. °2. Tighten the muscles in your legs and your buttocks. °3. Slowly lift your chest off of the floor while you keep your hips pressed to the floor. Keep the back of your head in line with the curve in your back. Your eyes should be looking at the floor. °4. Hold this position for 3-5 seconds. °5. Slowly return to your starting position. °SEEK MEDICAL CARE IF: °· Your back pain or discomfort gets much worse when you do an exercise. °· Your back pain or discomfort does not lessen within 2 hours after you exercise. °If you have any of these problems, stop doing these exercises right away. Do not do them again unless your health care provider says that you can. °SEEK IMMEDIATE MEDICAL CARE IF: °· You develop sudden, severe back pain. If this happens, stop doing the exercises right away. Do not do them again unless your health care provider says that you can. °  °This information is not intended to replace advice given to you by your health care provider. Make sure you discuss any questions you have with your health care provider. °  °Document Released: 06/16/2004 Document Revised: 01/28/2015 Document Reviewed: 07/03/2014 °Elsevier Interactive Patient Education ©2016 [employer] ° °Back Pain, Adult °Back pain is very common in adults. The cause of back pain is rarely dangerous and the pain often gets better over time. The cause of your back pain may not be known. Some common causes of back pain include: °· Strain of the muscles or ligaments supporting the spine. °· Wear and tear (degeneration) of the spinal disks. °· Arthritis. °· Direct injury  to the back. °For many people, back pain may return. Since back pain is rarely dangerous, most people can learn to manage this condition on their own. °HOME CARE INSTRUCTIONS °Watch your back pain for any changes. The following actions may help to lessen any discomfort you are feeling: °· Remain active. It is stressful on your back to sit or stand in one place for long periods of time. Do not sit, drive, or stand in one place for more than 30 minutes at a time. Take short walks on even surfaces as soon as you are able. Try to increase the length of time you walk each day. °· Exercise regularly as directed by your health care provider. Exercise helps your back heal faster. It also helps avoid future injury by keeping your muscles strong and flexible. °· Do not stay in   bed. Resting more than 1-2 days can delay your recovery. °· Pay attention to your body when you bend and lift. The most comfortable positions are those that put less stress on your recovering back. Always use proper lifting techniques, including: °¨ Bending your knees. °¨ Keeping the load close to your body. °¨ Avoiding twisting. °· Find a comfortable position to sleep. Use a firm mattress and lie on your side with your knees slightly bent. If you lie on your back, put a pillow under your knees. °· Avoid feeling anxious or stressed. Stress increases muscle tension and can worsen back pain. It is important to recognize when you are anxious or stressed and learn ways to manage it, such as with exercise. °· Take medicines only as directed by your health care provider. Over-the-counter medicines to reduce pain and inflammation are often the most helpful. Your health care provider may prescribe muscle relaxant drugs. These medicines help dull your pain so you can more quickly return to your normal activities and healthy exercise. °· Apply ice to the injured area: °¨ Put ice in a plastic bag. °¨ Place a towel between your skin and the bag. °¨ Leave the ice on  for 20 minutes, 2-3 times a day for the first 2-3 days. After that, ice and heat may be alternated to reduce pain and spasms. °· Maintain a healthy weight. Excess weight puts extra stress on your back and makes it difficult to maintain good posture. °SEEK MEDICAL CARE IF: °· You have pain that is not relieved with rest or medicine. °· You have increasing pain going down into the legs or buttocks. °· You have pain that does not improve in one week. °· You have night pain. °· You lose weight. °· You have a fever or chills. °SEEK IMMEDIATE MEDICAL CARE IF:  °· You develop new bowel or bladder control problems. °· You have unusual weakness or numbness in your arms or legs. °· You develop nausea or vomiting. °· You develop abdominal pain. °· You feel faint. °  °This information is not intended to replace advice given to you by your health care provider. Make sure you discuss any questions you have with your health care provider. °  °Document Released: 05/09/2005 Document Revised: 05/30/2014 Document Reviewed: 09/10/2013 °Elsevier Interactive Patient Education ©2016 [employer] ° °

## 2015-10-20 NOTE — ED Provider Notes (Signed)
CSN: [id number]     Arrival date & time 10/20/15  0840 History   First MD Initiated Contact with Patient 10/20/15 [phone]     Chief Complaint  Patient presents with  . Back Pain    HPI   45 year old female presents today with back pain. Patient reports that over the last 3 days she's had bilateral lower lumbar back pain. She describes this as an ache, worse with lower extremity and torso movements, worse with palpation. She denies any loss of distal sensation strength or motor function. Patient denies any abdominal pain, chest pain or shortness of breath. Patient reports that over the last week she's had cloudy urine, denies any dysuria, fever, chills, nausea, vomiting. Patient reports a history of pyelonephritis in the past that required hospitalization. Patient reports this feels different from episodes of pyelonephritis. Patient notes that she's had back pain previously for which she was prescribed hydrocodone, she notes taking 2 of those over the weekend that did not significantly improve her symptoms. Patient also reports taking Tylenol last night. Patient reports she is able to ambulate without difficulty   Past Medical History  Diagnosis Date  . Migraine   . Endometriosis   . PE (pulmonary embolism)    Past Surgical History  Procedure Laterality Date  . Abdominal hysterectomy    . Cesarean section    . Hernia repair    . Laparoscopy     No family history on file. Social History  Substance Use Topics  . Smoking status: Never Smoker   . Smokeless tobacco: None  . Alcohol Use: No   OB History    No data available     Review of Systems  All other systems reviewed and are negative.     Allergies  Review of patient's allergies indicates no known allergies.  Home Medications   Prior to Admission medications   Medication Sig Start Date End Date Taking? Authorizing Provider  albuterol (PROVENTIL HFA;VENTOLIN HFA) 108 (90 BASE) MCG/ACT inhaler Inhale into the lungs. 11/30/14  11/30/15  Historical Provider, MD  diphenhydrAMINE (BENADRYL) 25 MG tablet Take 25 mg by mouth at bedtime.      Historical Provider, MD  HYDROcodone-acetaminophen (NORCO) 5-325 MG tablet Take 1-2 tablets by mouth every 6 (six) hours as needed (for pain). 09/30/15   John Molpus, MD  tiZANidine (ZANAFLEX) 2 MG tablet TAKE 1 TO 2 TABLETS BY MOUTH EVERY 6 HOURS AS NEEDED 01/01/15   Historical Provider, MD  Topiramate ER (TROKENDI XR) 100 MG CP24 Take 100 capsules by mouth daily.    Historical Provider, MD  XARELTO 20 MG TABS tablet TAKE 1 TABLET(20 MG) BY MOUTH DAILY WITH DINNER 07/24/15   Josph Macho, MD   BP 114/80 mmHg  Pulse 69  Temp(Src) 98.1 F (36.7 C) (Oral)  Resp 18  Ht  (1.676 m)  Wt 86.183 kg  BMI 30.68 kg/m2  SpO2 96% Physical Exam  Constitutional: She is oriented to person, place, and time. She appears well-developed and well-nourished. No distress.  HENT:  Head: Normocephalic.  Neck: Normal range of motion. Neck supple.  Pulmonary/Chest: Effort normal.  Abdominal: Soft. She exhibits no distension and no mass. There is no tenderness. There is no rebound and no guarding.  Musculoskeletal: Normal range of motion. She exhibits tenderness. She exhibits no edema.  No C, T, or L spine tenderness to palpation. No obvious signs of trauma, deformity, infection, step-offs. Lung expansion normal. No scoliosis or kyphosis. Bilateral lower extremity strength 5  out of 5, sensation grossly intact,   Bilateral lower lumbar soft tissue tenderness  Straight leg negative Ambulates without difficulty  Neurological: She is alert and oriented to person, place, and time.  Skin: Skin is warm and dry. She is not diaphoretic.  Psychiatric: She has a normal mood and affect. Her behavior is normal. Judgment and thought content normal.  Nursing note and vitals reviewed.   ED Course  Procedures (including critical care time) Labs Review Labs Reviewed  URINALYSIS, ROUTINE W REFLEX MICROSCOPIC  [facility]) - Abnormal; Notable for the following:    APPearance CLOUDY (*)    All other components within normal limits    Imaging Review No results found. I have personally reviewed and evaluated these images and lab results as part of my medical decision-making.   EKG Interpretation None      MDM   Final diagnoses:  Bilateral low back pain without sciatica    Labs:  Imaging:  Consults:  Therapeutics:  Discharge Meds:   Assessment/Plan: 45 year old female with on-call daily back pain. This is most likely muscular in nature, she has no red flags, no signs of urinary tract infection. Patient has follow-up appointment with Dr. [facility] next week, she is encouraged to make this follow-up visit and inform him of today's back pain. Patient's constricted term cautions.         Eyvonne MechanicJeffrey Mayara Paulson, PA-C 10/20/15 1025  Raeford RazorStephen Kohut, MD 10/21/15 [phone]

## 2015-10-20 NOTE — ED Notes (Signed)
Patient here with 1 week of cloudy urine and odor to same. Denies dysuria. Complains of lower back pain x 3 days, pain with change in position. Denies injury

## 2016-03-29 ENCOUNTER — Encounter (HOSPITAL_BASED_OUTPATIENT_CLINIC_OR_DEPARTMENT_OTHER): Payer: Self-pay | Admitting: Emergency Medicine

## 2016-03-29 ENCOUNTER — Emergency Department (HOSPITAL_BASED_OUTPATIENT_CLINIC_OR_DEPARTMENT_OTHER)
Admission: EM | Admit: 2016-03-29 | Discharge: 2016-03-29 | Disposition: A | Discharge status: Home / Self Care | Payer: Self-pay | Attending: Emergency Medicine | Admitting: Emergency Medicine

## 2016-03-29 ENCOUNTER — Emergency Department (HOSPITAL_BASED_OUTPATIENT_CLINIC_OR_DEPARTMENT_OTHER): Payer: Self-pay

## 2016-03-29 DIAGNOSIS — M62838 Other muscle spasm: Secondary | ICD-10-CM | POA: Insufficient documentation

## 2016-03-29 LAB — BASIC METABOLIC PANEL
Anion gap: 7 (ref 5–15)
BUN: 17 mg/dL (ref 6–20)
CHLORIDE: 105 mmol/L (ref 101–111)
CO2: 27 mmol/L (ref 22–32)
Calcium: 9.4 mg/dL (ref 8.9–10.3)
Creatinine, Ser: 0.82 mg/dL (ref 0.44–1.00)
GFR calc Af Amer: >60 mL/min (ref >60)
GFR calc non Af Amer: >60 mL/min (ref >60)
GLUCOSE: 97 mg/dL (ref 65–99)
POTASSIUM: 3.7 mmol/L (ref 3.5–5.1)
Sodium: 139 mmol/L (ref 135–145)

## 2016-03-29 LAB — CBC
HEMATOCRIT: 37.6 % (ref 36.0–46.0)
Hemoglobin: 12.5 g/dL (ref 12.0–15.0)
MCH: 28.7 pg (ref 26.0–34.0)
MCHC: 33.2 g/dL (ref 30.0–36.0)
MCV: 86.2 fL (ref 78.0–100.0)
Platelets: 289 10*3/uL (ref 150–400)
RBC: 4.36 MIL/uL (ref 3.87–5.11)
RDW: 14.3 % (ref 11.5–15.5)
WBC: 7.3 10*3/uL (ref 4.0–10.5)

## 2016-03-29 LAB — TROPONIN I: Troponin I: <0.03 ng/mL (ref <0.03)

## 2016-03-29 MED ORDER — IBUPROFEN 800 MG PO TABS
800.0000 mg | ORAL_TABLET | Freq: Once | ORAL | Status: AC
  Administered 2016-03-29: 800 mg via ORAL
  Filled 2016-03-29: qty 1

## 2016-03-29 MED ORDER — DIAZEPAM 5 MG PO TABS
5.0000 mg | ORAL_TABLET | Freq: Once | ORAL | Status: AC
Start: 2016-03-29 — End: 2016-03-29
  Administered 2016-03-29: 5 mg via ORAL
  Filled 2016-03-29: qty 1

## 2016-03-29 MED ORDER — OXYCODONE HCL 5 MG PO TABS
5.0000 mg | ORAL_TABLET | Freq: Once | ORAL | Status: AC
  Administered 2016-03-29: 5 mg via ORAL
  Filled 2016-03-29: qty 1

## 2016-03-29 MED ORDER — ACETAMINOPHEN 500 MG PO TABS
1000.0000 mg | ORAL_TABLET | Freq: Once | ORAL | Status: AC
  Administered 2016-03-29: 1000 mg via ORAL
  Filled 2016-03-29: qty 2

## 2016-03-29 NOTE — ED Triage Notes (Signed)
Patient reports that she is having pain in her left arm. Reports that it starts in her left chest and radiates down her left arm. She reports that she is having SOB with this. The patient reports that it hurts more with sitting still, hurts less when she stretches it or raises it.

## 2016-03-29 NOTE — Discharge Instructions (Signed)
Take 4 over the counter ibuprofen tablets 3 times a day or 2 over-the-counter naproxen tablets twice a day for pain. Also take tylenol 1000mg(2 extra strength) four times a day.    

## 2016-03-29 NOTE — ED Provider Notes (Signed)
[facility] Provider Note   CSN: [id number] Arrival date & time: 03/29/16  1706     History   Chief Complaint Chief Complaint  Patient presents with  . Arm Pain    HPI Ellen Hood is a 45 y.o. female.  45 yo F with a chief complaint of left shoulder pain. This been going on for the past couple weeks. Worse with movement palpation. Having some pain with deep breaths. Denies any overt injury. Denies overuse injury. Denies fevers or chills denies cough or congestion.   The history is provided by the patient.  Shoulder Pain   This is a new problem. The current episode started more than 1 week ago. The problem occurs constantly. The problem has not changed since onset.The pain is present in the left shoulder. The quality of the pain is described as sharp and intermittent. The pain is at a severity of 8/10. The pain is moderate. Associated symptoms include tingling. She has tried nothing for the symptoms. The treatment provided no relief. There has been no history of extremity trauma.    Past Medical History:  Diagnosis Date  . Endometriosis   . Migraine   . PE (pulmonary embolism)     Patient Active Problem List   Diagnosis Date Noted  . Dyspnea 11/28/2014  . Pulmonary embolism (HCC) 11/27/2014    Past Surgical History:  Procedure Laterality Date  . ABDOMINAL HYSTERECTOMY    . CESAREAN SECTION    . HERNIA REPAIR    . LAPAROSCOPY      OB History    No data available       Home Medications    Prior to Admission medications   Medication Sig Start Date End Date Taking? Authorizing Provider  albuterol (PROVENTIL HFA;VENTOLIN HFA) 108 (90 BASE) MCG/ACT inhaler Inhale into the lungs. 11/30/14 11/30/15  Historical Provider, MD  diphenhydrAMINE (BENADRYL) 25 MG tablet Take 25 mg by mouth at bedtime.      Historical Provider, MD  HYDROcodone-acetaminophen (NORCO) 5-325 MG tablet Take 1-2 tablets by mouth every 6 (six) hours as needed (for pain). 09/30/15    John Molpus, MD  tiZANidine (ZANAFLEX) 2 MG tablet TAKE 1 TO 2 TABLETS BY MOUTH EVERY 6 HOURS AS NEEDED 01/01/15   Historical Provider, MD  Topiramate ER (TROKENDI XR) 100 MG CP24 Take 100 capsules by mouth daily.    Historical Provider, MD  XARELTO 20 MG TABS tablet TAKE 1 TABLET(20 MG) BY MOUTH DAILY WITH DINNER 07/24/15   Josph MachoPeter R Ennever, MD    Family History History reviewed. No pertinent family history.  Social History Social History  Substance Use Topics  . Smoking status: Never Smoker  . Smokeless tobacco: Never Used  . Alcohol use No     Allergies   Patient has no known allergies.   Review of Systems Review of Systems  Constitutional: Negative for chills and fever.  HENT: Negative for congestion and rhinorrhea.   Eyes: Negative for redness and visual disturbance.  Respiratory: Negative for shortness of breath and wheezing.   Cardiovascular: Negative for chest pain and palpitations.  Gastrointestinal: Negative for nausea and vomiting.  Genitourinary: Negative for dysuria and urgency.  Musculoskeletal: Negative for arthralgias and myalgias.  Skin: Negative for pallor and wound.  Neurological: Positive for tingling. Negative for dizziness and headaches.     Physical Exam Updated Vital Signs BP 126/82   Pulse 74   Temp 98.2 F (36.8 C) (Oral)   Resp 25   Ht 5'  6" (1.676 m)   Wt 190 lb (86.2 kg)   SpO2 96%   BMI 30.67 kg/m   Physical Exam  Constitutional: She is oriented to person, place, and time. She appears well-developed and well-nourished. No distress.  HENT:  Head: Normocephalic and atraumatic.  Eyes: EOM are normal. Pupils are equal, round, and reactive to light.  Neck: Normal range of motion. Neck supple.  Cardiovascular: Normal rate and regular rhythm.  Exam reveals no gallop and no friction rub.   No murmur heard. Pulmonary/Chest: Effort normal. She has no wheezes. She has no rales.  Abdominal: Soft. She exhibits no distension. There is no  tenderness.  Musculoskeletal: She exhibits tenderness. She exhibits no edema.  Point tender to the left trapezius muscle belly. Reproduces her symptoms. No noted midline spinal tenderness. Pulses and sensation is intact distally. Negative Spurling's.  Neurological: She is alert and oriented to person, place, and time.  Skin: Skin is warm and dry. She is not diaphoretic.  Psychiatric: She has a normal mood and affect. Her behavior is normal.  Nursing note and vitals reviewed.    ED Treatments / Results  Labs (all labs ordered are listed, but only abnormal results are displayed) Labs Reviewed  BASIC METABOLIC PANEL  CBC  TROPONIN I    EKG  EKG Interpretation  Date/Time:  Tuesday March 29 2016 17:23:52 EST Ventricular Rate:  80 PR Interval:    QRS Duration: 95 QT Interval:  411 QTC Calculation: 475 R Axis:   57 Text Interpretation:  Sinus rhythm Low voltage, precordial leads Borderline T abnormalities, anterior leads Baseline wander in lead(s) V1 No significant change since last tracing Confirmed by Issiah Huffaker MD, DANIEL [phone]) on 03/29/2016 5:57:33 PM       Radiology Dg Chest 2 View  Result Date: 03/29/2016 CLINICAL DATA:  Left-sided chest pain, arm pain, and shortness of breath for 2 weeks. EXAM: CHEST  2 VIEW COMPARISON:  12/30/2014 FINDINGS: The cardiac silhouette is upper limits of normal in size. There is mild chronic elevation of the right hemidiaphragm. No airspace consolidation, edema, pleural effusion, or pneumothorax is identified. No acute osseous abnormality is seen. IMPRESSION: No active cardiopulmonary disease. Electronically Signed   By: Sebastian AcheAllen  Grady M.D.   On: 03/29/2016 17:34    Procedures Procedures (including critical care time)  Medications Ordered in ED Medications  acetaminophen (TYLENOL) tablet 1,000 mg (1,000 mg Oral Given 03/29/16 1805)  ibuprofen (ADVIL,MOTRIN) tablet 800 mg (800 mg Oral Given 03/29/16 1805)  oxyCODONE (Oxy IR/ROXICODONE) immediate  release tablet 5 mg (5 mg Oral Given 03/29/16 1806)  diazepam (VALIUM) tablet 5 mg (5 mg Oral Given 03/29/16 1805)     Initial Impression / Assessment and Plan / ED Course  I have reviewed the triage vital signs and the nursing notes.  Pertinent labs & imaging results that were available during my care of the patient were reviewed by me and considered in my medical decision making (see chart for details).  Clinical Course     45 yo F With a chief complaint of left trapezius pain. Going on for 2 weeks. We'll place her in a sling. Tylenol and ibuprofen. PCP follow-up. Patient had a cardiac workup ordered in triage. Negative, d/c home.   6:32 PM:  I have discussed the diagnosis/risks/treatment options with the patient and family and believe the pt to be eligible for discharge home to follow-up with PCP. We also discussed returning to the ED immediately if new or worsening sx occur. We discussed  the sx which are most concerning (e.g., sudden worsening pain, fever, inability to tolerate by mouth) that necessitate immediate return. Medications administered to the patient during their visit and any new prescriptions provided to the patient are listed below.  Medications given during this visit Medications  acetaminophen (TYLENOL) tablet 1,000 mg (1,000 mg Oral Given 03/29/16 1805)  ibuprofen (ADVIL,MOTRIN) tablet 800 mg (800 mg Oral Given 03/29/16 1805)  oxyCODONE (Oxy IR/ROXICODONE) immediate release tablet 5 mg (5 mg Oral Given 03/29/16 1806)  diazepam (VALIUM) tablet 5 mg (5 mg Oral Given 03/29/16 1805)     The patient appears reasonably screen and/or stabilized for discharge and I doubt any other medical condition or other [facility] requiring further screening, evaluation, or treatment in the ED at this time prior to discharge.      Final Clinical Impressions(s) / ED Diagnoses   Final diagnoses:  Trapezius muscle spasm    New Prescriptions New Prescriptions   No medications on file     Melene Plan, DO 03/29/16 1610

## 2016-04-30 ENCOUNTER — Emergency Department (HOSPITAL_BASED_OUTPATIENT_CLINIC_OR_DEPARTMENT_OTHER)
Admission: EM | Admit: 2016-04-30 | Discharge: 2016-04-30 | Disposition: A | Discharge status: Home / Self Care | Payer: Self-pay | Attending: Emergency Medicine | Admitting: Emergency Medicine

## 2016-04-30 ENCOUNTER — Encounter (HOSPITAL_BASED_OUTPATIENT_CLINIC_OR_DEPARTMENT_OTHER): Payer: Self-pay | Admitting: Emergency Medicine

## 2016-04-30 ENCOUNTER — Emergency Department (HOSPITAL_BASED_OUTPATIENT_CLINIC_OR_DEPARTMENT_OTHER): Payer: Self-pay

## 2016-04-30 DIAGNOSIS — W19XXXA Unspecified fall, initial encounter: Secondary | ICD-10-CM

## 2016-04-30 DIAGNOSIS — Y939 Activity, unspecified: Secondary | ICD-10-CM | POA: Insufficient documentation

## 2016-04-30 DIAGNOSIS — Y999 Unspecified external cause status: Secondary | ICD-10-CM | POA: Insufficient documentation

## 2016-04-30 DIAGNOSIS — Y929 Unspecified place or not applicable: Secondary | ICD-10-CM | POA: Insufficient documentation

## 2016-04-30 DIAGNOSIS — M549 Dorsalgia, unspecified: Secondary | ICD-10-CM

## 2016-04-30 DIAGNOSIS — W01198A Fall on same level from slipping, tripping and stumbling with subsequent striking against other object, initial encounter: Secondary | ICD-10-CM | POA: Insufficient documentation

## 2016-04-30 DIAGNOSIS — S161XXA Strain of muscle, fascia and tendon at neck level, initial encounter: Secondary | ICD-10-CM | POA: Insufficient documentation

## 2016-04-30 MED ORDER — KETOROLAC TROMETHAMINE 30 MG/ML IJ SOLN
30.0000 mg | Freq: Once | INTRAMUSCULAR | Status: AC
  Administered 2016-04-30: 30 mg via INTRAMUSCULAR
  Filled 2016-04-30: qty 1

## 2016-04-30 MED ORDER — HYDROCODONE-ACETAMINOPHEN 5-325 MG PO TABS
1.0000 | ORAL_TABLET | Freq: Once | ORAL | Status: AC
  Administered 2016-04-30: 1 via ORAL
  Filled 2016-04-30: qty 1

## 2016-04-30 MED ORDER — CYCLOBENZAPRINE HCL 10 MG PO TABS: 10.0000 mg | ORAL_TABLET | Freq: Two times a day (BID) | ORAL | 0 refills | Status: DC | PRN

## 2016-04-30 NOTE — ED Notes (Signed)
Patient transported to X-ray 

## 2016-04-30 NOTE — ED Provider Notes (Signed)
[facility] Note   CSN: [id number] Arrival date & time: 04/30/16  1723  By signing my name below, I, Octavia Heir, attest that this documentation has been prepared under the direction and in the presence of Rise Mu, PA-C.  Electronically Signed: Octavia Heir, ED Scribe. 04/30/16. 6:00 PM.    History   Chief Complaint Chief Complaint  Patient presents with  . Fall    The history is provided by the patient. No language interpreter was used.   HPI Comments: Ellen Hood is a 45 y.o. female who presents to the Emergency Department complaining of a moderate, upper neck pain and diffuse back pain s/p a fall that occurred yesterday. She notes the pain radiates into her waist. Pt says that she was in [employer] yesterday when she slipped on water and fell straight back. She reports immediately getting a headache after her fall. Pt did not hit her head. Denies LOC. Pt expresses that her symptoms were mild yesterday after the fall occurred but she notes that when she woke up this morning, her diffuse back soreness was worse. Pt has not taken any medication to relieve her pain. She says turning her neck to certain directions increases her pain. Pt has been off of Xarelto for the past 4-5 months after being diagnosed with a PE last June. Pt denies fever, chills, headache, vision changes, lightheadedness, dizziness, chest pain, shortness of breath, abdominal pain, nausea, emesis, urinary symptoms, change in bowel habits, numbness/tingling. Patient denies any urinary retention, loss of bowel or bladder, saddle paresthesias, history of drug use, history of cancer, numbness/tingling. Patient has been ambulatory since the event.  Past Medical History:  Diagnosis Date  . Endometriosis   . Migraine   . PE (pulmonary embolism)     Patient Active Problem List   Diagnosis Date Noted  . Dyspnea 11/28/2014  . Pulmonary embolism (HCC) 11/27/2014    Past Surgical  History:  Procedure Laterality Date  . ABDOMINAL HYSTERECTOMY    . CESAREAN SECTION    . HERNIA REPAIR    . LAPAROSCOPY      OB History    No data available       Home Medications    Prior to Admission medications   Medication Sig Start Date End Date Taking? Authorizing Provider  albuterol (PROVENTIL HFA;VENTOLIN HFA) 108 (90 BASE) MCG/ACT inhaler Inhale into the lungs. 11/30/14 11/30/15  Historical Provider, MD  diphenhydrAMINE (BENADRYL) 25 MG tablet Take 25 mg by mouth at bedtime.      Historical Provider, MD  HYDROcodone-acetaminophen (NORCO) 5-325 MG tablet Take 1-2 tablets by mouth every 6 (six) hours as needed (for pain). 09/30/15   John Molpus, MD  tiZANidine (ZANAFLEX) 2 MG tablet TAKE 1 TO 2 TABLETS BY MOUTH EVERY 6 HOURS AS NEEDED 01/01/15   Historical Provider, MD  Topiramate ER (TROKENDI XR) 100 MG CP24 Take 100 capsules by mouth daily.    Historical Provider, MD  XARELTO 20 MG TABS tablet TAKE 1 TABLET(20 MG) BY MOUTH DAILY WITH DINNER 07/24/15   Josph Macho, MD    Family History History reviewed. No pertinent family history.  Social History Social History  Substance Use Topics  . Smoking status: Never Smoker  . Smokeless tobacco: Never Used  . Alcohol use No     Allergies   Patient has no known allergies.   Review of Systems Review of Systems  Constitutional: Negative for chills and fever.  Cardiovascular: Negative for chest pain.  Gastrointestinal: Negative for abdominal pain, diarrhea, nausea and vomiting.  Genitourinary: Negative for dysuria, frequency, hematuria and urgency.  Musculoskeletal: Positive for back pain and myalgias.  Skin: Negative.   Neurological: Negative for dizziness, weakness, light-headedness, numbness and headaches.  All other systems reviewed and are negative.    Physical Exam Updated Vital Signs BP 123/92 (BP Location: Right Arm)   Pulse 74   Temp 98.3 F (36.8 C) (Oral)   Resp 16   Ht 5\' 6"  (1.676 m)   Wt 83.5 kg    SpO2 98%   BMI 29.70 kg/m   Physical Exam  Constitutional: She is oriented to person, place, and time. She appears well-developed and well-nourished.  HENT:  Head: Normocephalic and atraumatic.  Mouth/Throat: Oropharynx is clear and moist.  No signs of trauma to skull.  Eyes: Conjunctivae and EOM are normal. Pupils are equal, round, and reactive to light. Right eye exhibits no discharge. Left eye exhibits no discharge. No scleral icterus.  Neck: Normal range of motion. Neck supple.  Patient with full range of motion. She does have midline C-spine tenderness. She also exhibits C-spine paraspinal tenderness that radiates into the upper trapezius with tense musculature.  Cardiovascular: Normal rate, regular rhythm, normal heart sounds and intact distal pulses.   Pulmonary/Chest: Effort normal and breath sounds normal.  Abdominal: Soft. Bowel sounds are normal. She exhibits no distension. There is no tenderness. There is no rebound and no guarding.  Musculoskeletal: Normal range of motion.  Patient with midline T-spine and L-spine tenderness. No deformities or step-offs noted. She also exhibits diffuse paraspinal tenderness. Patient with full range of motion. Strength is not out of 5 in all she needs. Sensation intact. Refilled nor. Radial and pedal pulses are 2+ bilaterally. Pelvis is stable. The all 4 extremities without any difficulties. Patient is ambulatory.  Lymphadenopathy:    She has no cervical adenopathy.  Neurological: She is alert and oriented to person, place, and time.  The patient is alert, attentive, and oriented x 3. Speech is clear. Cranial nerve II-VII grossly intact. Negative pronator drift. Sensation intact. Strength 5/5 in all extremities. Reflexes 2+ and symmetric at biceps, triceps, knees, and ankles. Rapid alternating movement and fine finger movements intact. Romberg is absent. Posture and gait normal.   Skin: Skin is warm and dry. Capillary refill takes less than 2  seconds.  Psychiatric: She has a normal mood and affect.  Nursing note and vitals reviewed.    ED Treatments / Results  DIAGNOSTIC STUDIES: Oxygen Saturation is 100% on RA, normal by my interpretation.  COORDINATION OF CARE:  5:57 PM Discussed treatment plan with pt at bedside and pt agreed to plan.  Labs (all labs ordered are listed, but only abnormal results are displayed) Labs Reviewed - No data to display  EKG  EKG Interpretation None       Radiology Dg Cervical Spine Complete  Result Date: 04/30/2016 CLINICAL DATA:  Slip and fall yesterday with neck pain, initial encounter EXAM: CERVICAL SPINE - COMPLETE 4+ VIEW COMPARISON:  None. FINDINGS: Seven cervical segments are well visualized. Mild osteophytic changes are noted at C4-5, C5-6 and C6-7. Loss of the normal cervical lordosis is noted likely related to muscular spasm. Mild neural foraminal narrowing is noted at C5-6 and C6-7 bilaterally. No acute fracture or acute facet abnormality is noted. The odontoid is within normal limits. No soft tissue changes are seen. IMPRESSION: Degenerative change as described. Electronically Signed   By: Alcide CleverMark  Lukens M.D.   On:  04/30/2016 18:46   Dg Thoracic Spine 2 View  Result Date: 04/30/2016 CLINICAL DATA:  Recent fall yesterday with upper back pain, initial encounter EXAM: THORACIC SPINE 2 VIEWS COMPARISON:  03/29/2016 FINDINGS: There is no evidence of thoracic spine fracture. Alignment is normal. No other significant bone abnormalities are identified. IMPRESSION: No acute abnormality noted. Electronically Signed   By: Alcide CleverMark  Lukens M.D.   On: 04/30/2016 18:49   Dg Lumbar Spine Complete  Result Date: 04/30/2016 CLINICAL DATA:  Slip and fall yesterday with low back pain, initial encounter EXAM: LUMBAR SPINE - COMPLETE 4+ VIEW COMPARISON:  None. FINDINGS: There is no evidence of lumbar spine fracture. Alignment is normal. Intervertebral disc spaces are maintained. IMPRESSION: No acute  abnormality noted. Electronically Signed   By: Alcide CleverMark  Lukens M.D.   On: 04/30/2016 18:50   Dg Hips Bilat W Or Wo Pelvis 2 Views  Result Date: 04/30/2016 CLINICAL DATA:  Slip and fall yesterday with hip pain, initial encounter EXAM: DG HIP (WITH OR WITHOUT PELVIS) 2V BILAT COMPARISON:  None. FINDINGS: There is no evidence of hip fracture or dislocation. There is no evidence of arthropathy or other focal bone abnormality. IMPRESSION: No acute abnormality noted. Electronically Signed   By: Alcide CleverMark  Lukens M.D.   On: 04/30/2016 18:52    Procedures Procedures (including critical care time)  Medications Ordered in ED Medications  HYDROcodone-acetaminophen (NORCO/VICODIN) 5-325 MG per tablet 1 tablet (1 tablet Oral Given 04/30/16 1839)  ketorolac (TORADOL) 30 MG/ML injection 30 mg (30 mg Intramuscular Given 04/30/16 1903)     Initial Impression / Assessment and Plan / ED Course  I have reviewed the triage vital signs and the nursing notes.  Pertinent labs & imaging results that were available during my care of the patient were reviewed by me and considered in my medical decision making (see chart for details).  Clinical Course   Patient presents to ED today with back pain after sustaining a mechanical fall yesterday. Patient denies hitting her head. She denies LOC. Negative [place] CT score. No focal neurological deficits. Imaging of head not indicated at this time. X-rays of spine and pelvis were unremarkable for acute fractures. Patient is ambulatory. She denies any red flag symptoms. Likely MSK pain. Encouraged symptomatic treatment. Pt is hemodynamically stable, in NAD, & able to ambulate in the ED. Pain has been managed & has no complaints prior to dc. Pt is comfortable with above plan and is stable for discharge at this time. All questions were answered prior to disposition. Strict return precautions for f/u to the ED were discussed. Encouraged follow up with pcp this week if symptoms do not  improve.   I personally performed the services described in this documentation, which was scribed in my presence. The recorded information has been reviewed and is accurate.  Final Clinical Impressions(s) / ED Diagnoses   Final diagnoses:  Fall, initial encounter  Strain of neck muscle, initial encounter  Other acute back pain    New Prescriptions Discharge Medication List as of 04/30/2016  7:04 PM    START taking these medications   Details  cyclobenzaprine (FLEXERIL) 10 MG tablet Take 1 tablet (10 mg total) by mouth 2 (two) times daily as needed for muscle spasms., Starting Sat 04/30/2016, Print         Rise MuKenneth T Leaphart, PA-C 05/01/16 [id number]    Pricilla LovelessScott Goldston, MD 05/03/16 1235

## 2016-04-30 NOTE — ED Notes (Signed)
PT c/o back pain after fall, denies LOC. Pt states initially pain after fall was mild, but when she awoke this AM pain to back was worse. Pt has not taken any medication for pain today.

## 2016-04-30 NOTE — ED Triage Notes (Signed)
Patient reports that she fell yesterday  - the patient reports that she was in a standing position

## 2016-04-30 NOTE — Discharge Instructions (Signed)
Please take the flexeril for muscle spasms. Continue to use heating pad and soak in warm bath with epson salt. You may take ibuprofen and tylenol for pain. Please follow up with your primary care doctor this week. Return to the Ed if you develop worsening headache, vision changes, nausea, vomiting, abdominal pain, loss of your bowels or bladder, are unable to urinate, or develop numbness or tingling.

## 2017-01-21 ENCOUNTER — Encounter (HOSPITAL_BASED_OUTPATIENT_CLINIC_OR_DEPARTMENT_OTHER): Payer: Self-pay | Admitting: Emergency Medicine

## 2017-01-21 ENCOUNTER — Emergency Department (HOSPITAL_BASED_OUTPATIENT_CLINIC_OR_DEPARTMENT_OTHER): Payer: BLUE CROSS/BLUE SHIELD

## 2017-01-21 ENCOUNTER — Emergency Department (HOSPITAL_BASED_OUTPATIENT_CLINIC_OR_DEPARTMENT_OTHER)
Admission: EM | Admit: 2017-01-21 | Discharge: 2017-01-21 | Disposition: A | Discharge status: Home / Self Care | Payer: BLUE CROSS/BLUE SHIELD | Attending: Emergency Medicine | Admitting: Emergency Medicine

## 2017-01-21 DIAGNOSIS — S99921A Unspecified injury of right foot, initial encounter: Secondary | ICD-10-CM | POA: Diagnosis present

## 2017-01-21 DIAGNOSIS — S92411A Displaced fracture of proximal phalanx of right great toe, initial encounter for closed fracture: Secondary | ICD-10-CM

## 2017-01-21 DIAGNOSIS — Y9389 Activity, other specified: Secondary | ICD-10-CM | POA: Diagnosis not present

## 2017-01-21 DIAGNOSIS — Y92019 Unspecified place in single-family (private) house as the place of occurrence of the external cause: Secondary | ICD-10-CM | POA: Diagnosis not present

## 2017-01-21 DIAGNOSIS — W1789XA Other fall from one level to another, initial encounter: Secondary | ICD-10-CM | POA: Diagnosis not present

## 2017-01-21 DIAGNOSIS — Z79899 Other long term (current) drug therapy: Secondary | ICD-10-CM | POA: Diagnosis not present

## 2017-01-21 DIAGNOSIS — S92311A Displaced fracture of first metatarsal bone, right foot, initial encounter for closed fracture: Secondary | ICD-10-CM | POA: Diagnosis not present

## 2017-01-21 DIAGNOSIS — Z9889 Other specified postprocedural states: Secondary | ICD-10-CM | POA: Insufficient documentation

## 2017-01-21 DIAGNOSIS — Y999 Unspecified external cause status: Secondary | ICD-10-CM | POA: Insufficient documentation

## 2017-01-21 MED ORDER — OXYCODONE-ACETAMINOPHEN 5-325 MG PO TABS: 1.0000 | ORAL_TABLET | Freq: Four times a day (QID) | ORAL | 0 refills | Status: DC | PRN

## 2017-01-21 MED ORDER — OXYCODONE-ACETAMINOPHEN 5-325 MG PO TABS
1.0000 | ORAL_TABLET | Freq: Once | ORAL | Status: AC
  Administered 2017-01-21: 1 via ORAL
  Filled 2017-01-21: qty 1

## 2017-01-21 MED ORDER — IBUPROFEN 400 MG PO TABS
400.0000 mg | ORAL_TABLET | Freq: Once | ORAL | Status: AC | PRN
  Administered 2017-01-21: 400 mg via ORAL
  Filled 2017-01-21: qty 1

## 2017-01-21 NOTE — ED Triage Notes (Signed)
Patient reports bunionectomy 8 weeks ago to right foot. Today she was standing on a chair that tipped over, injuring her right foot. Patient states pain in 10/10, has not treated her pain PTA.

## 2017-01-21 NOTE — ED Provider Notes (Signed)
[facility] Provider [facility]   CSN: [id number] Arrival date & time: 01/21/17  2044     History   Chief Complaint Chief Complaint  Patient presents with  . Foot Pain    right foot    HPI Rick Duffarisienne Portal is a 46 y.o. female.  Patient is a 46 year old female with a history of recent bunionectomy 8 weeks ago who was standing on a bench to close her blinds when it broke and she landed on her right foot. This is the same foot that she had the surgery and which prior to that had been healing well and she had recently come out of the walking boot. Since the fall she has had severe pain in her right great toe and has been unable to bear weight. She denies any other injury   The history is provided by the patient.  Foot Pain  This is a new problem. The current episode started 1 to 2 hours ago. The problem occurs constantly. The problem has not changed since onset.Associated symptoms comments: Pain in the right great toe.. The symptoms are aggravated by walking. Nothing relieves the symptoms. She has tried rest and a cold compress for the symptoms. The treatment provided no relief.    Past Medical History:  Diagnosis Date  . Endometriosis   . Migraine   . PE (pulmonary embolism)     Patient Active Problem List   Diagnosis Date Noted  . Dyspnea 11/28/2014  . Pulmonary embolism (HCC) 11/27/2014    Past Surgical History:  Procedure Laterality Date  . ABDOMINAL HYSTERECTOMY    . BUNIONECTOMY Right   . CESAREAN SECTION    . HERNIA REPAIR    . LAPAROSCOPY      OB History    No data available       Home Medications    Prior to Admission medications   Medication Sig Start Date End Date Taking? Authorizing Provider  albuterol (PROVENTIL HFA;VENTOLIN HFA) 108 (90 BASE) MCG/ACT inhaler Inhale into the lungs. 11/30/14 11/30/15  [provider]  diphenhydrAMINE (BENADRYL) 25 MG tablet Take 25 mg by mouth at bedtime.      [provider]    oxyCODONE-acetaminophen (PERCOCET/ROXICET) 5-325 MG tablet Take 1 tablet by mouth every 6 (six) hours as needed for severe pain. 01/21/17   Gwyneth SproutPlunkett, Canesha Tesfaye, MD  Topiramate ER (TROKENDI XR) 100 MG CP24 Take 100 capsules by mouth daily.    [provider]    Family History History reviewed. No pertinent family history.  Social History Social History  Substance Use Topics  . Smoking status: Never Smoker  . Smokeless tobacco: Never Used  . Alcohol use No     Allergies   Patient has no known allergies.   Review of Systems Review of Systems  All other systems reviewed and are negative.    Physical Exam Updated Vital Signs BP 120/83   Pulse 70   Temp 98.3 F (36.8 C)   Resp 18   Ht 5' 5.5" (1.664 m)   Wt 89.8 kg (198 lb)   SpO2 100%   BMI 32.45 kg/m   Physical Exam  Constitutional: She is oriented to person, place, and time. She appears well-developed and well-nourished. No distress.  Cardiovascular: Normal rate.   Pulmonary/Chest: Effort normal.  Musculoskeletal: She exhibits tenderness.       Right ankle: Normal.       Right foot: There is decreased range of motion, tenderness, bony tenderness and swelling. There is normal  capillary refill.       Feet:  Neurological: She is alert and oriented to person, place, and time.  Nursing note and vitals reviewed.    ED Treatments / Results  Labs (all labs ordered are listed, but only abnormal results are displayed) Labs Reviewed - No data to display  EKG  EKG Interpretation None       Radiology Dg Foot Complete Right  Result Date: 01/21/2017 CLINICAL DATA:  Patient status post foot surgery 8 weeks ago. Status post fall today with trauma to the first digit. EXAM: RIGHT FOOT COMPLETE - 3+ VIEW COMPARISON:  None. FINDINGS: A radiopaque screw is identified in the distal first metatarsal with radiolucency identified in the distal first metatarsal suggesting fracture line. There is mild displaced comminuted  fracture of the right first proximal phalanx. Soft tissue swelling of the first digit is identified. IMPRESSION: A radiopaque screw is identified in the distal first metatarsal with radiolucency identified in the distal first metatarsal suggesting fracture line. There is mild displaced comminuted fracture of the right first proximal phalanx. Electronically Signed   By: Sherian Rein M.D.   On: 01/21/2017 21:20    Procedures Procedures (including critical care time)  Medications Ordered in ED Medications  oxyCODONE-acetaminophen (PERCOCET/ROXICET) 5-325 MG per tablet 1 tablet (not administered)  ibuprofen (ADVIL,MOTRIN) tablet 400 mg (400 mg Oral Given 01/21/17 2121)     Initial Impression / Assessment and Plan / ED Course  I have reviewed the triage vital signs and the nursing notes.  Pertinent labs & imaging results that were available during my care of the patient were reviewed by me and considered in my medical decision making (see chart for details).     Patient presenting after mechanical fall today and severe pain in the right great toe. Patient recently had a bunionectomy was healing well. On x-ray today patient is found to have a fracture over the pin where the bunion was removed as well as a proximal phalanx fracture. Patient was instructed to go back into the boot and use crutches. She was given pain control.  Final Clinical Impressions(s) / ED Diagnoses   Final diagnoses:  Displaced fracture of first metatarsal bone, right foot, initial encounter for closed fracture  Closed displaced fracture of proximal phalanx of right great toe, initial encounter    New Prescriptions New Prescriptions   OXYCODONE-ACETAMINOPHEN (PERCOCET/ROXICET) 5-325 MG TABLET    Take 1 tablet by mouth every 6 (six) hours as needed for severe pain.     Gwyneth Sprout, MD 01/21/17 [phone]

## 2017-05-08 ENCOUNTER — Other Ambulatory Visit: Payer: Self-pay | Admitting: Family

## 2017-05-08 DIAGNOSIS — I2699 Other pulmonary embolism without acute cor pulmonale: Secondary | ICD-10-CM

## 2017-05-09 ENCOUNTER — Ambulatory Visit: Payer: BLUE CROSS/BLUE SHIELD | Admitting: Family

## 2017-05-09 ENCOUNTER — Other Ambulatory Visit: Payer: BLUE CROSS/BLUE SHIELD

## 2017-05-12 ENCOUNTER — Ambulatory Visit: Payer: BLUE CROSS/BLUE SHIELD | Admitting: Family

## 2017-05-12 ENCOUNTER — Other Ambulatory Visit: Payer: BLUE CROSS/BLUE SHIELD

## 2017-06-24 ENCOUNTER — Other Ambulatory Visit: Payer: Self-pay

## 2017-06-24 ENCOUNTER — Encounter (HOSPITAL_BASED_OUTPATIENT_CLINIC_OR_DEPARTMENT_OTHER): Payer: Self-pay | Admitting: Adult Health

## 2017-06-24 ENCOUNTER — Emergency Department (HOSPITAL_BASED_OUTPATIENT_CLINIC_OR_DEPARTMENT_OTHER)
Admission: EM | Admit: 2017-06-24 | Discharge: 2017-06-24 | Disposition: A | Discharge status: Home / Self Care | Payer: BLUE CROSS/BLUE SHIELD | Attending: Emergency Medicine | Admitting: Emergency Medicine

## 2017-06-24 DIAGNOSIS — Z79899 Other long term (current) drug therapy: Secondary | ICD-10-CM | POA: Insufficient documentation

## 2017-06-24 DIAGNOSIS — R109 Unspecified abdominal pain: Secondary | ICD-10-CM | POA: Diagnosis present

## 2017-06-24 DIAGNOSIS — R110 Nausea: Secondary | ICD-10-CM | POA: Diagnosis not present

## 2017-06-24 DIAGNOSIS — N12 Tubulo-interstitial nephritis, not specified as acute or chronic: Secondary | ICD-10-CM | POA: Diagnosis not present

## 2017-06-24 LAB — BASIC METABOLIC PANEL
ANION GAP: 7 (ref 5–15)
BUN: 16 mg/dL (ref 6–20)
CO2: 23 mmol/L (ref 22–32)
Calcium: 8.8 mg/dL — ABNORMAL LOW (ref 8.9–10.3)
Chloride: 111 mmol/L (ref 101–111)
Creatinine, Ser: 0.9 mg/dL (ref 0.44–1.00)
GFR calc Af Amer: >60 mL/min (ref >60)
GFR calc non Af Amer: >60 mL/min (ref >60)
GLUCOSE: 89 mg/dL (ref 65–99)
POTASSIUM: 3.6 mmol/L (ref 3.5–5.1)
Sodium: 141 mmol/L (ref 135–145)

## 2017-06-24 LAB — URINALYSIS, MICROSCOPIC (REFLEX)

## 2017-06-24 LAB — CBC WITH DIFFERENTIAL/PLATELET
Basophils Absolute: 0 10*3/uL (ref 0.0–0.1)
Basophils Relative: 1 %
Eosinophils Absolute: 0.1 10*3/uL (ref 0.0–0.7)
Eosinophils Relative: 2 %
HEMATOCRIT: 36.3 % (ref 36.0–46.0)
Hemoglobin: 11.8 g/dL — ABNORMAL LOW (ref 12.0–15.0)
LYMPHS ABS: 2.5 10*3/uL (ref 0.7–4.0)
LYMPHS PCT: 35 %
MCH: 28.6 pg (ref 26.0–34.0)
MCHC: 32.5 g/dL (ref 30.0–36.0)
MCV: 87.9 fL (ref 78.0–100.0)
MONO ABS: 0.5 10*3/uL (ref 0.1–1.0)
Monocytes Relative: 7 %
NEUTROS ABS: 4 10*3/uL (ref 1.7–7.7)
Neutrophils Relative %: 55 %
Platelets: 262 10*3/uL (ref 150–400)
RBC: 4.13 MIL/uL (ref 3.87–5.11)
RDW: 14.4 % (ref 11.5–15.5)
WBC: 7.2 10*3/uL (ref 4.0–10.5)

## 2017-06-24 LAB — URINALYSIS, ROUTINE W REFLEX MICROSCOPIC
Bilirubin Urine: NEGATIVE
Glucose, UA: NEGATIVE mg/dL
Hgb urine dipstick: NEGATIVE
KETONES UR: NEGATIVE mg/dL
Nitrite: POSITIVE — AB
PROTEIN: NEGATIVE mg/dL
Specific Gravity, Urine: 1.015 (ref 1.005–1.030)
pH: 7.5 (ref 5.0–8.0)

## 2017-06-24 MED ORDER — TRAMADOL HCL 50 MG PO TABS: 50.0000 mg | ORAL_TABLET | Freq: Four times a day (QID) | ORAL | 0 refills | Status: DC | PRN

## 2017-06-24 MED ORDER — DEXTROSE 5 % IV SOLN
1.0000 g | Freq: Once | INTRAVENOUS | Status: AC
  Administered 2017-06-24: 1 g via INTRAVENOUS
  Filled 2017-06-24: qty 10

## 2017-06-24 MED ORDER — OXYCODONE-ACETAMINOPHEN 5-325 MG PO TABS
1.0000 | ORAL_TABLET | Freq: Once | ORAL | Status: AC
  Administered 2017-06-24: 1 via ORAL
  Filled 2017-06-24: qty 1

## 2017-06-24 MED ORDER — LEVOFLOXACIN 750 MG PO TABS: 750.0000 mg | ORAL_TABLET | Freq: Every day | ORAL | 0 refills | Status: DC

## 2017-06-24 MED ORDER — KETOROLAC TROMETHAMINE 15 MG/ML IJ SOLN
15.0000 mg | Freq: Once | INTRAMUSCULAR | Status: AC
Start: 2017-06-24 — End: 2017-06-24
  Administered 2017-06-24: 15 mg via INTRAVENOUS
  Filled 2017-06-24: qty 1

## 2017-06-24 MED ORDER — NAPROXEN 500 MG PO TABS: 500.0000 mg | ORAL_TABLET | Freq: Two times a day (BID) | ORAL | 0 refills | Status: AC

## 2017-06-24 NOTE — ED Provider Notes (Signed)
[facility] EMERGENCY DEPARTMENT Provider Note   CSN: [id number] Arrival date & time: 06/24/17  1346     History   Chief Complaint Chief Complaint  Patient presents with  . Flank Pain    HPI Ellen Hood is a 47 y.o. female who presents with constant left flank pain for the past 3 days. Patient states that the pain is an achy sensation. States at worst pain is a 10/10, worse with movement, no alleviating factors. Patient has tried heating pad and tylenol without relief.  States this AM she developed some nausea and sense of generally not feeling well. States she has had ongoing abnormal sulfa type smell to urine for the past few months- seen by obgyn and PCP, had urinalysis, and was told UAs were normal, states this abnormal smell has persisted. Denies fever, dysuria, hematuria, urgency, frequency, vomiting, diarrhea, blood in stool, or vaginal bleeding/discharge. Patient has a hx of pyelonephritis in 2004 and states this feels similar.   HPI  Past Medical History:  Diagnosis Date  . Endometriosis   . Migraine   . PE (pulmonary embolism)     Patient Active Problem List   Diagnosis Date Noted  . Dyspnea 11/28/2014  . Pulmonary embolism (HCC) 11/27/2014    Past Surgical History:  Procedure Laterality Date  . ABDOMINAL HYSTERECTOMY    . BUNIONECTOMY Right   . CESAREAN SECTION    . HERNIA REPAIR    . LAPAROSCOPY      OB History    No data available       Home Medications    Prior to Admission medications   Medication Sig Start Date End Date Taking? Authorizing Provider  diphenhydrAMINE (BENADRYL) 25 MG tablet Take 25 mg by mouth at bedtime.     Yes [provider]  tizanidine (ZANAFLEX) 2 MG capsule Take 2 mg by mouth 3 (three) times daily.   Yes [provider]  Topiramate ER (TROKENDI XR) 100 MG CP24 Take 100 capsules by mouth daily.   Yes [provider]  albuterol (PROVENTIL HFA;VENTOLIN HFA) 108 (90 BASE) MCG/ACT inhaler  Inhale into the lungs. 11/30/14 11/30/15  [provider]  oxyCODONE-acetaminophen (PERCOCET/ROXICET) 5-325 MG tablet Take 1 tablet by mouth every 6 (six) hours as needed for severe pain. 01/21/17   Gwyneth Sprout, MD    Family History History reviewed. No pertinent family history.  Social History Social History   Tobacco Use  . Smoking status: Never Smoker  . Smokeless tobacco: Never Used  Substance Use Topics  . Alcohol use: No    Alcohol/week: 0.0 oz  . Drug use: No     Allergies   Patient has no known allergies.   Review of Systems Review of Systems  Constitutional: Negative for chills and fever.       Positive for general malaise.  Gastrointestinal: Positive for nausea. Negative for blood in stool, constipation, diarrhea and vomiting.  Genitourinary: Positive for flank pain (left). Negative for difficulty urinating, dysuria, frequency, hematuria, urgency, vaginal bleeding and vaginal discharge.  Neurological: Negative for weakness and numbness.  All other systems reviewed and are negative.   Physical Exam Updated Vital Signs BP 133/87 (BP Location: Left Arm)   Pulse 86   Temp 98 F (36.7 C) (Oral)   Resp 20   Ht 5\' 6"  (1.676 m)   Wt 87.1 kg (192 lb)   SpO2 98%   BMI 30.99 kg/m   Physical Exam  Constitutional: She appears well-developed and well-nourished. No  distress.  HENT:  Head: Normocephalic and atraumatic.  Eyes: Conjunctivae are normal. Right eye exhibits no discharge. Left eye exhibits no discharge.  Cardiovascular: Normal rate and regular rhythm.  No murmur heard. Pulmonary/Chest: Breath sounds normal. No respiratory distress. She has no wheezes. She has no rales.  Abdominal: Soft. Normal appearance and bowel sounds are normal. She exhibits no distension. There is tenderness (LLQ mild). There is CVA tenderness (L). There is no rigidity, no rebound and no guarding.  Musculoskeletal:  Back: There is no midline tenderness.  Neurological: She  is alert.  Clear speech.  Sensation grossly intact bilateral lower extremities.  5 out of 5 strength with plantar and dorsiflexion bilaterally.  Gait is intact.  Skin: Skin is warm and dry. No rash noted.  Psychiatric: She has a normal mood and affect. Her behavior is normal.  Nursing note and vitals reviewed.   ED Treatments / Results  Labs Results for orders placed or performed during the hospital encounter of 06/24/17  Urinalysis, Routine w reflex microscopic- may I&O cath if menses  Result Value Ref Range   Color, Urine YELLOW YELLOW   APPearance CLOUDY (A) CLEAR   Specific Gravity, Urine 1.015 1.005 - 1.030   pH 7.5 5.0 - 8.0   Glucose, UA NEGATIVE NEGATIVE mg/dL   Hgb urine dipstick NEGATIVE NEGATIVE   Bilirubin Urine NEGATIVE NEGATIVE   Ketones, ur NEGATIVE NEGATIVE mg/dL   Protein, ur NEGATIVE NEGATIVE mg/dL   Nitrite POSITIVE (A) NEGATIVE   Leukocytes, UA TRACE (A) NEGATIVE  Urinalysis, Microscopic (reflex)  Result Value Ref Range   RBC / HPF 0-5 0 - 5 RBC/hpf   WBC, UA 6-30 0 - 5 WBC/hpf   Bacteria, UA MANY (A) NONE SEEN   Squamous Epithelial / LPF 0-5 (A) NONE SEEN  Basic metabolic panel  Result Value Ref Range   Sodium 141 135 - 145 mmol/L   Potassium 3.6 3.5 - 5.1 mmol/L   Chloride 111 101 - 111 mmol/L   CO2 23 22 - 32 mmol/L   Glucose, Bld 89 65 - 99 mg/dL   BUN 16 6 - 20 mg/dL   Creatinine, Ser 1.61 0.44 - 1.00 mg/dL   Calcium 8.8 (L) 8.9 - 10.3 mg/dL   GFR calc non Af Amer >60 >60 mL/min   GFR calc Af Amer >60 >60 mL/min   Anion gap 7 5 - 15  CBC with Differential  Result Value Ref Range   WBC 7.2 4.0 - 10.5 K/uL   RBC 4.13 3.87 - 5.11 MIL/uL   Hemoglobin 11.8 (L) 12.0 - 15.0 g/dL   HCT 09.6 04.5 - 40.9 %   MCV 87.9 78.0 - 100.0 fL   MCH 28.6 26.0 - 34.0 pg   MCHC 32.5 30.0 - 36.0 g/dL   RDW 81.1 91.4 - 78.2 %   Platelets 262 150 - 400 K/uL   Neutrophils Relative % 55 %   Neutro Abs 4.0 1.7 - 7.7 K/uL   Lymphocytes Relative 35 %   Lymphs Abs 2.5  0.7 - 4.0 K/uL   Monocytes Relative 7 %   Monocytes Absolute 0.5 0.1 - 1.0 K/uL   Eosinophils Relative 2 %   Eosinophils Absolute 0.1 0.0 - 0.7 K/uL   Basophils Relative 1 %   Basophils Absolute 0.0 0.0 - 0.1 K/uL   No results found. EKG  EKG Interpretation None      Radiology No results found.  Procedures Procedures (including critical care time)  Medications Ordered in  ED Medications  ketorolac (TORADOL) 15 MG/ML injection 15 mg (15 mg Intravenous Given 06/24/17 1550)  cefTRIAXone (ROCEPHIN) 1 g in dextrose 5 % 50 mL IVPB (0 g Intravenous Stopped 06/24/17 1641)  oxyCODONE-acetaminophen (PERCOCET/ROXICET) 5-325 MG per tablet 1 tablet (1 tablet Oral Given 06/24/17 1646)     Initial Impression / Assessment and Plan / ED Course  I have reviewed the triage vital signs and the nursing notes.  Pertinent labs & imaging results that were available during my care of the patient were reviewed by me and considered in my medical decision making (see chart for details).   Patient presents with left flank pain. She is nontoxic appearing, in no apparent distress, vitals WNL other than diastolic BP elevated- no indication of HTN emergency. On exam patient with L CVA tenderness and mild LLQ tenderness- no rebound/rigidity/guarding. UA findings ordered per triage consistent with infection. Will further evaluate with CBC and BMP. Will administer Toradol and 1 g IV Rocephin to initiate treatment for pyelonephritis.   Labs grossly unremarkable. Of note patient's BUN and creatinine are WNL. There is no leukocytosis or significant electrolyte abnormality. Hgb 11.8 somewhat decreased from previous lab work on chart review patient typically has hgb 12-13. Upon discussion of this with patient she reports she attempted to donate blood a couple months ago and was unable to due to her hgb being 10, therefore today this is improved.  She denies vaginal bleeding or blood in stool, discussed need for recheck and  potentil further evaluation by PCP.  On repeat exam patient does not have a surgical abdomen, no peritoneal signs. No indication of appendicitis, bowel obstruction, bowel perforation, cholecystitis, or diverticulitis.   16:40: RE-EVAL: Patient's pain improved following Toradol, however still uncomfortable. Will order PO Percocet.   17:15: RE-EVA: Patient states she is feeling much better and ready to go home. She is tolerating PO fluids and crackers.    Patient is tolerating PO and she is afebrile. Will discharge home with treatment for pyelonephritis with prescriptions for Levaquin, Naproxen, and Tramadol, [place] Controlled Substance reporting System queried.  I discussed results, treatment plan, need for close PCP follow-up, and strict return precautions with the patient including, but not limited to fever, vomiting, and worsening pain. Provided opportunity for questions, patient confirmed understanding and is in agreement with plan.   Findings and plan of care discussed with supervising physician Dr. Fredderick PhenixBelfi who is in agreement with plan.   Final Clinical Impressions(s) / ED Diagnoses   Final diagnoses:  Pyelonephritis    ED Discharge Orders        Ordered    levofloxacin (LEVAQUIN) 750 MG tablet  Daily     06/24/17 1717    traMADol (ULTRAM) 50 MG tablet  Every 6 hours PRN     06/24/17 1717    naproxen (NAPROSYN) 500 MG tablet  2 times daily     06/24/17 1717       Kobie Whidby, [place] CitySamantha R, PA-C 06/24/17 1731    Rolan BuccoBelfi, Melanie, MD 06/24/17 2311

## 2017-06-24 NOTE — ED Triage Notes (Addendum)
Presents with left flankn pain that began two days ago, she reports that she has an awful sulfur smell to her urine. PAin is described as dull ache and has moved slightly from flank to side to lower abdomen. THe pain is constant and assocaited with slight nausea. Nothing makes pain better. Denies fevers. Denies chills. Urine is cloudy and concentrated.  CVA tenderness noted.

## 2017-06-24 NOTE — Discharge Instructions (Signed)
You were seen in the emergency department and diagnosed with Pyelonephritis which is an infection of your kidney- please see the attached handout for further information.   Your lab work in the emergency department was all fairly normal.  Your kidney function was normal.  Your hemoglobin was somewhat low at 11.8, have this rechecked by your primary care provider.  You received a dose of IV antibiotics in the the emergency department today as well as pain medications.   We have given you multiple prescriptions today: - Levaquin-this is an antibiotic used to treat the infection, you will need to take this daily. Please take all of your antibiotics until finished. You may develop abdominal discomfort or diarrhea from the antibiotic.  You may help offset this with probiotics which you can buy at the store (ask your pharmacist if unable to find) or get probiotics in the form of eating yogurt. Do not eat or take the probiotics until 2 hours after your antibiotic. If you are unable to tolerate these side effects follow-up with your primary care provider or return to the emergency department.   If you begin to experience any blistering, rashes, swelling, or difficulty breathing seek medical care for evaluation of potentially more serious side effects.   Please be aware that this medication may interact with other medications you are taking, please be sure to discuss your medication list with your pharmacist.   - Naproxen- Naproxen is a nonsteroidal anti-inflammatory medication that will help with pain and swelling. Be sure to take this medication as prescribed with food, 1 pill every 12 hours,  It should be taken with food, as it can cause stomach upset, and more seriously, stomach bleeding. Do not take other nonsteroidal anti-inflammatory medications with this such as Advil, Motrin, or Aleve.   - Tramadol- this is a stronger pain medication that you can take 1-2 tablets of every 6 hours as needed for pain.   Do not drive or operate heavy machinery when taking this medication.   Follow-up with your primary care provider on Monday or Tuesday of this week (02/04 or 02/05) for re-evaluation.  Return to the emergency department for any new or worsening symptoms including but not limited to worsening pain, vomiting, fevers, or any other concerns that you may have.

## 2017-06-27 LAB — URINE CULTURE: Culture: >=100000 — AB

## 2017-06-28 ENCOUNTER — Telehealth: Payer: Self-pay | Admitting: Emergency Medicine

## 2017-06-28 NOTE — Telephone Encounter (Signed)
Post ED Visit - Positive Culture Follow-up  Culture report reviewed by antimicrobial stewardship pharmacist:  []  Enzo BiNathan Batchelder, Pharm.D. []  Celedonio MiyamotoJeremy Frens, Pharm.D., BCPS AQ-ID [x]  Garvin FilaMike Maccia, Pharm.D., BCPS []  Georgina PillionElizabeth Martin, Pharm.D., BCPS []  [place]landvilleMinh Pham, [address] Rainbow BoulevardPharm.D., BCPS, AAHIVP []  Estella HuskMichelle Turner, Pharm.D., BCPS, AAHIVP []  Lysle Pearlachel Rumbarger, PharmD, BCPS []  Blake DivineShannon Parkey, PharmD []  Pollyann SamplesAndy Johnston, PharmD, BCPS  Positive urine culture Treated with levofloxacin, organism sensitive to the same and no further patient follow-up is required at this time.  Berle MullMiller, Elishua Radford 06/28/2017, 2:32 PM

## 2017-07-03 ENCOUNTER — Encounter (HOSPITAL_BASED_OUTPATIENT_CLINIC_OR_DEPARTMENT_OTHER): Payer: Self-pay | Admitting: *Deleted

## 2017-07-03 ENCOUNTER — Emergency Department (HOSPITAL_BASED_OUTPATIENT_CLINIC_OR_DEPARTMENT_OTHER)
Admission: EM | Admit: 2017-07-03 | Discharge: 2017-07-03 | Disposition: A | Discharge status: Home / Self Care | Payer: BLUE CROSS/BLUE SHIELD | Attending: Emergency Medicine | Admitting: Emergency Medicine

## 2017-07-03 ENCOUNTER — Other Ambulatory Visit: Payer: Self-pay

## 2017-07-03 DIAGNOSIS — Z5321 Procedure and treatment not carried out due to patient leaving prior to being seen by health care provider: Secondary | ICD-10-CM | POA: Insufficient documentation

## 2017-07-03 DIAGNOSIS — M549 Dorsalgia, unspecified: Secondary | ICD-10-CM | POA: Insufficient documentation

## 2017-07-03 HISTORY — DX: Dorsalgia, unspecified: M54.9

## 2017-07-03 NOTE — ED Triage Notes (Signed)
Pt c/o upper left back pain  X 1 week , hx of same , naproxen w/o relief

## 2017-07-04 ENCOUNTER — Encounter (HOSPITAL_BASED_OUTPATIENT_CLINIC_OR_DEPARTMENT_OTHER): Payer: Self-pay | Admitting: Emergency Medicine

## 2017-07-04 ENCOUNTER — Emergency Department (HOSPITAL_BASED_OUTPATIENT_CLINIC_OR_DEPARTMENT_OTHER): Payer: BLUE CROSS/BLUE SHIELD

## 2017-07-04 ENCOUNTER — Other Ambulatory Visit: Payer: Self-pay

## 2017-07-04 ENCOUNTER — Emergency Department (HOSPITAL_BASED_OUTPATIENT_CLINIC_OR_DEPARTMENT_OTHER)
Admission: EM | Admit: 2017-07-04 | Discharge: 2017-07-04 | Disposition: A | Discharge status: Home / Self Care | Payer: BLUE CROSS/BLUE SHIELD | Attending: Emergency Medicine | Admitting: Emergency Medicine

## 2017-07-04 DIAGNOSIS — Z79899 Other long term (current) drug therapy: Secondary | ICD-10-CM | POA: Diagnosis not present

## 2017-07-04 DIAGNOSIS — M5412 Radiculopathy, cervical region: Secondary | ICD-10-CM

## 2017-07-04 DIAGNOSIS — M542 Cervicalgia: Secondary | ICD-10-CM | POA: Diagnosis present

## 2017-07-04 MED ORDER — OXYCODONE-ACETAMINOPHEN 5-325 MG PO TABS: 1.0000 | ORAL_TABLET | Freq: Four times a day (QID) | ORAL | 0 refills | Status: AC | PRN

## 2017-07-04 MED ORDER — PREDNISONE 10 MG PO TABS: 40.0000 mg | ORAL_TABLET | Freq: Every day | ORAL | 0 refills | Status: AC

## 2017-07-04 MED ORDER — OXYCODONE-ACETAMINOPHEN 5-325 MG PO TABS
1.0000 | ORAL_TABLET | Freq: Once | ORAL | Status: AC
  Administered 2017-07-04: 1 via ORAL
  Filled 2017-07-04: qty 1

## 2017-07-04 MED ORDER — PREDNISONE 50 MG PO TABS
60.0000 mg | ORAL_TABLET | Freq: Once | ORAL | Status: AC
  Administered 2017-07-04: 60 mg via ORAL
  Filled 2017-07-04: qty 1

## 2017-07-04 MED ORDER — CYCLOBENZAPRINE HCL 10 MG PO TABS: 10.0000 mg | ORAL_TABLET | Freq: Three times a day (TID) | ORAL | 0 refills | Status: AC

## 2017-07-04 MED ORDER — ACETAMINOPHEN 325 MG PO TABS
650.0000 mg | ORAL_TABLET | Freq: Once | ORAL | Status: AC
  Administered 2017-07-04: 650 mg via ORAL
  Filled 2017-07-04: qty 2

## 2017-07-04 MED ORDER — PREDNISONE 20 MG PO TABS: 40.0000 mg | ORAL_TABLET | Freq: Every day | ORAL | Status: DC

## 2017-07-04 MED FILL — CYCLOBENZAPRINE HCL 10 MG T: 10 | 5 days supply | Qty: 15 | Fill #0

## 2017-07-04 MED FILL — OXYCODONE-ACETAMINOPHEN 5-3: 5-325 | 2 days supply | Qty: 8 | Fill #0

## 2017-07-04 MED FILL — predniSONE 10 MG TABS: 10 | 7 days supply | Qty: 28 | Fill #0

## 2017-07-04 NOTE — ED Triage Notes (Signed)
Pt c/o upper left back pain  X 1 week , hx of same , naproxen w/o relief  - was here yesterday and left without being seen

## 2017-07-04 NOTE — ED Provider Notes (Signed)
[facility] EMERGENCY DEPARTMENT Provider Note   CSN: [id number] Arrival date & time: 07/04/17  1122     History   Chief Complaint No chief complaint on file.   HPI Ellen Hood is a 47 y.o. female with history of pulmonary embolism is here for evaluation of acute on chronic, gradually worsening midline cervical spine pain described as sharp, radiating to the left shoulder, shoulder blade, shoulder, upper extremity and into fingertips. Pain is worse with lateral rotation of the neck to the left, palpation and movement of the shoulder. Has noticed intermittent tingling to all her fingertips. Has taken naproxen, tramadol, heat therapy without significant relief. States she has known degenerative disc disease in her cervical spine that has caused her neck pain similar in the past but never this severe. She has received steroid injections in her neck by neurosurgeon a while ago but not recently and she has not followed up with neurosurgery. States the pain began while she was leaning down and reaching for something in the fridge, she felt like she got stuck and the pain has gradually worsened since then.  She denies any frank neck rigidity, headache, vision changes, fevers, chills, generalized rash, chest pain, shortness of breath, numbness, weakness or heaviness to her upper extremities. No recent trauma or falls. No IV drug use. No recent injections to her neck.  HPI  Past Medical History:  Diagnosis Date  . Back pain   . Endometriosis   . Migraine   . PE (pulmonary embolism)     Patient Active Problem List   Diagnosis Date Noted  . Dyspnea 11/28/2014  . Pulmonary embolism (HCC) 11/27/2014    Past Surgical History:  Procedure Laterality Date  . ABDOMINAL HYSTERECTOMY    . BUNIONECTOMY Right   . CESAREAN SECTION    . HERNIA REPAIR    . LAPAROSCOPY      OB History    No data available       Home Medications    Prior to Admission medications   Medication  Sig Start Date End Date Taking? Authorizing Provider  albuterol (PROVENTIL HFA;VENTOLIN HFA) 108 (90 BASE) MCG/ACT inhaler Inhale into the lungs. 11/30/14 11/30/15  [provider]  cyclobenzaprine (FLEXERIL) 10 MG tablet Take 1 tablet (10 mg total) by mouth 3 (three) times daily for 5 days. 07/04/17 07/09/17  Liberty Handy, PA-C  diphenhydrAMINE (BENADRYL) 25 MG tablet Take 25 mg by mouth at bedtime.      [provider]  naproxen (NAPROSYN) 500 MG tablet Take 1 tablet (500 mg total) by mouth 2 (two) times daily. 06/24/17   Petrucelli, Pleas Koch, PA-C  oxyCODONE-acetaminophen (PERCOCET/ROXICET) 5-325 MG tablet Take 1 tablet by mouth every 6 (six) hours as needed for up to 2 days for severe pain. 07/04/17 07/06/17  Liberty Handy, PA-C  predniSONE (DELTASONE) 10 MG tablet Take 4 tablets (40 mg total) by mouth daily for 7 days. 07/04/17 07/11/17  Liberty Handy, PA-C  tizanidine (ZANAFLEX) 2 MG capsule Take 2 mg by mouth 3 (three) times daily.    [provider]  Topiramate ER (TROKENDI XR) 100 MG CP24 Take 100 capsules by mouth daily.    [provider]  traMADol (ULTRAM) 50 MG tablet Take 1 tablet (50 mg total) by mouth every 6 (six) hours as needed. 06/24/17   Petrucelli, Pleas Koch, PA-C    Family History History reviewed. No pertinent family history.  Social History Social History   Tobacco Use  .  Smoking status: Never Smoker  . Smokeless tobacco: Never Used  Substance Use Topics  . Alcohol use: No    Alcohol/week: 0.0 oz  . Drug use: No     Allergies   Patient has no known allergies.   Review of Systems Review of Systems  Musculoskeletal: Positive for myalgias and neck pain.  Neurological:       +paresthesias to RUE   All other systems reviewed and are negative.    Physical Exam Updated Vital Signs BP 108/68 (BP Location: Right Arm)   Pulse 70   Temp 98 F (36.7 C) (Oral)   Resp 16   Ht 5\' 6"  (1.676 m)   Wt 86.2 kg (190 lb)    SpO2 99%   BMI 30.67 kg/m   Physical Exam  Constitutional: She is oriented to person, place, and time. She appears well-developed and well-nourished. No distress.  NAD.  HENT:  Head: Normocephalic and atraumatic.  Right Ear: External ear normal.  Left Ear: External ear normal.  Nose: Nose normal.  Eyes: Conjunctivae and EOM are normal. No scleral icterus.  Neck: Neck supple. Spinous process tenderness and muscular tenderness present. Decreased range of motion present.    Focal, midline c-spine tenderness over C2-C7 w/o crepitus or step offs. Tenderness to light palpation to left cervical paraspinal musculature with increased tone, extending to left trapezius and lateral deltoid. Decreased neck ROM due to pain although pt is able to touch chin to chest. No frank meningeal signs.   Cardiovascular: Normal rate, regular rhythm and normal heart sounds.  No murmur heard. Pulmonary/Chest: Effort normal and breath sounds normal. She has no wheezes.  Musculoskeletal: She exhibits no deformity.       Left shoulder: She exhibits tenderness.  Spurling Maneuver to left reproduced pain and paresthesias to forearm. Pain with light palpation of left shoulder, anteriorly, laterally and posteriorly. Pt cannot abduct left arm away from body due to pain, question poor effort. She does not let me passively move her shoulder due to pain. She has no focal bony tenderness to left scapula or bony prominences of shoulder.   Neurological: She is alert and oriented to person, place, and time.  Reflex Scores:      Bicep reflexes are 2+ on the right side and 2+ on the left side.      Brachioradialis reflexes are 2+ on the right side and 2+ on the left side. Sensation in median, ulnar, radial nerve distribution in upper extremities intact  5/5 strength with hand grip bilaterally 5/5 finger abduction and adduction bilaterall Good, symmetric biceps and brachioradialis DTR bilaterally  Skin: Skin is warm and dry.  Capillary refill takes less than 2 seconds.  No rash to anterior chest wall, back or left upper extremity. No erythema, edema, fluctuance, warmth to RUE.  Psychiatric: She has a normal mood and affect. Her behavior is normal. Judgment and thought content normal.  Nursing note and vitals reviewed.    ED Treatments / Results  Labs (all labs ordered are listed, but only abnormal results are displayed) Labs Reviewed - No data to display  EKG  EKG Interpretation None       Radiology Dg Cervical Spine Complete  Result Date: 07/04/2017 CLINICAL DATA:  One-week history of left cervical/neck pain radiating to the upper left shoulder and into the left upper extremity. No known injuries either recently or remotely. EXAM: CERVICAL SPINE - COMPLETE 4+ VIEW COMPARISON:  Cervical spine x-rays 04/30/2016, 02/18/2014 and MRI cervical spine 02/18/2014. FINDINGS:  Reversal of the usual cervical lordosis centered at C5-6. Anatomic posterior alignment. No visible fractures. Normal prevertebral soft tissues. Disc space narrowing at C4-5 (mild), C5-6 (moderate to severe) and C6-7 (moderate), unchanged since December, 2017 and slightly progressive since September, 2015. Allowing for the degree of obliquity, left greater than right foraminal stenoses at C5-6 and C6-7. No static evidence of instability. IMPRESSION: 1. Reversal of the usual lordosis which may reflect positioning and/or spasm. 2. Moderate to severe degenerative disc disease and spondylosis at C5-6, moderate degenerative disc disease and spondylosis at C6-7 and mild degenerative disc disease and spondylosis at C4-5. Findings are stable since December, 2017 and slightly progressive since September, 2015. 3. Bilateral foraminal stenoses are suspected at C5-6 and C6-7, left greater than right. Electronically Signed   By: Hulan Saashomas  Lawrence M.D.   On: 07/04/2017 16:45   Dg Thoracic Spine 2 View  Result Date: 07/04/2017 CLINICAL DATA:  One-week history of left  cervical/neck and left upper back pain pain radiating to the upper left shoulder and into the left upper extremity. No known injuries either recently or remotely. EXAM: THORACIC SPINE 2 VIEWS COMPARISON:  Thoracic spine x-rays 04/30/2016. Bone window images from CTA chest 03/06/2015 and 12/30/2014. FINDINGS: Twelve rib-bearing thoracic vertebrae with anatomic posterior alignment. No fractures. Slight thoracic dextroscoliosis, unchanged. Well-preserved disc spaces throughout the thoracic spine. No significant thoracic spondylosis. Intact pedicles. IMPRESSION: Slight thoracic dextroscoliosis.  Otherwise normal examination. Electronically Signed   By: Hulan Saashomas  Lawrence M.D.   On: 07/04/2017 16:47    Procedures Procedures (including critical care time)  Medications Ordered in ED Medications  oxyCODONE-acetaminophen (PERCOCET/ROXICET) 5-325 MG per tablet 1 tablet (1 tablet Oral Given 07/04/17 1601)  acetaminophen (TYLENOL) tablet 650 mg (650 mg Oral Given 07/04/17 1601)  predniSONE (DELTASONE) tablet 60 mg (60 mg Oral Given 07/04/17 1601)     Initial Impression / Assessment and Plan / ED Course  I have reviewed the triage vital signs and the nursing notes.  Pertinent labs & imaging results that were available during my care of the patient were reviewed by me and considered in my medical decision making (see chart for details).  Clinical Course as of Jul 04 1734  Tue Jul 04, 2017  1704  IMPRESSION: 1. Reversal of the usual lordosis which may reflect positioning and/or spasm. 2. Moderate to severe degenerative disc disease and spondylosis at C5-6, moderate degenerative disc disease and spondylosis at C6-7 and mild degenerative disc disease and spondylosis at C4-5. Findings are stable since December, 2017 and slightly progressive since September, 2015. 3. Bilateral foraminal stenoses are suspected at C5-6 and C6-7, left greater than right.  DG Cervical Spine Complete [CG]  1704 IMPRESSION: Slight  thoracic dextroscoliosis. Otherwise normal examination.  [CG]    Clinical Course User Index [CG] Liberty HandyGibbons, Nayelis Bonito J, PA-C   47 year old with known degenerative disc disease of the cervical spine and previous neck pain, here for acute on chronic left-sided cervical spine pain that sounds radicular in nature. No trauma. No fevers, chills, generalized rash, frank nuchal rigidity on exam to suggest meningitis. No IV drug use, signs of septic arthritis, overlying rash. Exam and history are not consistent with septic arthritis, osteomyelitis, shingles. On exam, Spurling maneuver reproduced paresthesias radiating to the fingertips. She has no focal numbness or weakness. She has good tendon reflexes bilaterally. X-ray shows progression of degenerative disc disease, spondylylosis and bilateral foraminal stenosis worse on the left. This is consistent with presentation and exam findings. We'll treat with a short course  of steroids, muscle relaxer, high-dose anti-inflammatories, pain medication. Discussed workup with patient and plan to discharge, I advised she follow up with neurosurgery. Discussed return precautions. Patient verbalized understanding and is agreeable.  Final Clinical Impressions(s) / ED Diagnoses   Final diagnoses:  Cervical radiculopathy    ED Discharge Orders        Ordered    oxyCODONE-acetaminophen (PERCOCET/ROXICET) 5-325 MG tablet  Every 6 hours PRN     07/04/17 1707    cyclobenzaprine (FLEXERIL) 10 MG tablet  3 times daily     07/04/17 1707    predniSONE (DELTASONE) 10 MG tablet  Daily     07/04/17 1708       Liberty Handy, PA-C 07/04/17 1736    Pricilla Loveless, MD 07/05/17 [phone]

## 2017-07-04 NOTE — ED Notes (Signed)
ED Provider at bedside. 

## 2017-07-04 NOTE — Discharge Instructions (Signed)
You have moderate to severe degenerative disc disease of C6-7 and mild disease in C4-5. There have been slight progression since last x-ray done in 2015.  There is also stenosis (narrowing) of foramen greater on the left than right. This can explain your symptoms. Take prednisone, flexeril, percocet, naproxen as prescribed. Additionally, take 650 mg tylenol every 6 hours.  Wear your sling for comfort. You need evaluation by neurosurgery for further management and pain control.  They may recommend outpatient MRI.   Return for numbness, weakness, heaviness to your upper extremities, fevers, chills, neck rigidity with rash on your body.

## 2017-07-05 NOTE — ED Notes (Signed)
07/06/2107, Follow-up call completed.  All questions answered.  Pt. Very grateful for the call.

## 2017-07-19 ENCOUNTER — Encounter (HOSPITAL_BASED_OUTPATIENT_CLINIC_OR_DEPARTMENT_OTHER): Payer: Self-pay | Admitting: *Deleted

## 2017-07-19 ENCOUNTER — Emergency Department (HOSPITAL_BASED_OUTPATIENT_CLINIC_OR_DEPARTMENT_OTHER)
Admission: EM | Admit: 2017-07-19 | Discharge: 2017-07-19 | Disposition: A | Discharge status: Home / Self Care | Payer: BLUE CROSS/BLUE SHIELD | Attending: Emergency Medicine | Admitting: Emergency Medicine

## 2017-07-19 DIAGNOSIS — M5410 Radiculopathy, site unspecified: Secondary | ICD-10-CM | POA: Diagnosis not present

## 2017-07-19 DIAGNOSIS — Z79899 Other long term (current) drug therapy: Secondary | ICD-10-CM | POA: Insufficient documentation

## 2017-07-19 DIAGNOSIS — M25512 Pain in left shoulder: Secondary | ICD-10-CM | POA: Diagnosis present

## 2017-07-19 MED ORDER — GABAPENTIN 100 MG PO CAPS: 100.0000 mg | ORAL_CAPSULE | Freq: Three times a day (TID) | ORAL | 0 refills | Status: DC

## 2017-07-19 MED ORDER — HYDROCODONE-ACETAMINOPHEN 5-325 MG PO TABS
2.0000 | ORAL_TABLET | Freq: Once | ORAL | Status: AC
  Administered 2017-07-19: 2 via ORAL
  Filled 2017-07-19: qty 2

## 2017-07-19 MED FILL — GABAPENTIN 100 MG CAPSULE: 100 | 14 days supply | Qty: 42 | Fill #0

## 2017-07-19 NOTE — ED Provider Notes (Signed)
[facility] EMERGENCY DEPARTMENT Provider Note   CSN: [id number] Arrival date & time: 07/19/17  1559     History   Chief Complaint Chief Complaint  Patient presents with  . CHronic pain    HPI Ellen Hood is a 47 y.o. female who comes in for evaluation of persistent left shoulder, neck pain that is been ongoing for the last 3 weeks.  Patient reports that this is been a intermittent ongoing problem for the last several years.  She states that she has been suffering from a pinched nerve but that she usually will have flares that will last a week or 2 and then resolved.  Patient reports that this most recent flare began approximately 3 weeks ago.  At that time, she was evaluated in the ED for her symptoms.  X-rays at that time showed degenerative disc disease but otherwise no acute abnormality.  Patient was discharged home with prednisone, Flexeril, Percocet.  Patient comes the ED today because despite taking medications, she is continued to have persistent pain.  She states the pain is on the left side of her shoulder and radiates down her left upper extremity and shoots into her hand.  She states that the pain is a "sharp, shooting pain"that she will intermittently have numbness and tingling.  She states that the pain is worsened if she sits down for long extended periods of time or if she moves the shoulder.  She denies any new trauma, injury, fall since her last ED evaluation.  She states no new changes or worsening of symptoms but that symptoms have persisted.  Patient was given neurosurgery referral on her last ED appointment.  Patient states that she never called neurosurgery to arrange an appointment.  Has been referred to pain management by her primary care doctor but states that she has not seen the pain management doctor yet.  Patient denies any fever, vision changes, chest pain, rash, difficulty breathing, back pain, saddle anesthesia, urinary incontinence, difficulty  speaking, facial drooping.  The history is provided by the patient.    Past Medical History:  Diagnosis Date  . Back pain   . Endometriosis   . Migraine   . PE (pulmonary embolism)     Patient Active Problem List   Diagnosis Date Noted  . Dyspnea 11/28/2014  . Pulmonary embolism (HCC) 11/27/2014    Past Surgical History:  Procedure Laterality Date  . ABDOMINAL HYSTERECTOMY    . BUNIONECTOMY Right   . CESAREAN SECTION    . HERNIA REPAIR    . LAPAROSCOPY      OB History    No data available       Home Medications    Prior to Admission medications   Medication Sig Start Date End Date Taking? Authorizing Provider  albuterol (PROVENTIL HFA;VENTOLIN HFA) 108 (90 BASE) MCG/ACT inhaler Inhale into the lungs. 11/30/14 11/30/15  [provider]  diphenhydrAMINE (BENADRYL) 25 MG tablet Take 25 mg by mouth at bedtime.      [provider]  gabapentin (NEURONTIN) 100 MG capsule Take 1 capsule (100 mg total) by mouth 3 (three) times daily for 14 days. 07/19/17 08/02/17  Maxwell Caul, PA-C  naproxen (NAPROSYN) 500 MG tablet Take 1 tablet (500 mg total) by mouth 2 (two) times daily. 06/24/17   Petrucelli, Samantha R, PA-C  tizanidine (ZANAFLEX) 2 MG capsule Take 2 mg by mouth 3 (three) times daily.    [provider]  Topiramate ER (TROKENDI XR) 100 MG CP24  Take 100 capsules by mouth daily.    [provider]  traMADol (ULTRAM) 50 MG tablet Take 1 tablet (50 mg total) by mouth every 6 (six) hours as needed. 06/24/17   Petrucelli, Pleas KochSamantha R, PA-C    Family History History reviewed. No pertinent family history.  Social History Social History   Tobacco Use  . Smoking status: Never Smoker  . Smokeless tobacco: Never Used  Substance Use Topics  . Alcohol use: No    Alcohol/week: 0.0 oz  . Drug use: No     Allergies   Patient has no known allergies.   Review of Systems Review of Systems  Constitutional: Negative for fever.  Eyes:  Negative for visual disturbance.  Respiratory: Negative for shortness of breath.   Cardiovascular: Negative for chest pain.  Gastrointestinal: Negative for vomiting.  Musculoskeletal: Positive for neck pain. Negative for back pain and gait problem.       Left shoulder pain  Neurological: Positive for numbness (Intermittent). Negative for speech difficulty and weakness.     Physical Exam Updated Vital Signs BP 116/85 (BP Location: Right Arm)   Pulse (!) 103   Temp 98.2 F (36.8 C) (Oral)   Resp 18   Ht 5\' 6"  (1.676 m)   Wt 94 kg (207 lb 3.2 oz)   SpO2 100%   BMI 33.44 kg/m   Physical Exam  Constitutional: She is oriented to person, place, and time. She appears well-developed and well-nourished.  HENT:  Head: Normocephalic and atraumatic.  Mouth/Throat: Oropharynx is clear and moist and mucous membranes are normal.  Eyes: Conjunctivae, EOM and lids are normal. Pupils are equal, round, and reactive to light.  Neck: Full passive range of motion without pain.    Diffuse muscular tenderness overlying the paraspinal muscles of the left cervical region.  No midline bony tenderness.  No deformity or crepitus noted.  Flexion/extension of the neck intact without difficulty.  Some pain with moving lateral motion but is able to complete the movements.  Positive Spurling's.  Cardiovascular: Normal rate, regular rhythm, normal heart sounds and normal pulses. Exam reveals no gallop and no friction rub.  No murmur heard. Pulmonary/Chest: Effort normal and breath sounds normal.  Abdominal: Soft. Normal appearance. There is no tenderness. There is no rigidity and no guarding.  Musculoskeletal: Normal range of motion.       Arms: No deformity or crepitus noted of the left shoulder, left clavicle.  No tenderness palpation to the anterior aspect of the shoulder.  Diffuse tenderness palpation overlying the left trapezius muscle that extends down the left paraspinal muscles of the thoracic region.  No  midline bony tenderness of the thoracic or lumbar region.  No deformity or crepitus noted.  No abnormalities of the right upper extremity.  Limited range of motion of the left upper extremity secondary to patient's pain.  Active range of motion is limited secondary to her pain but with passive range of motion she is able to tolerate movement.  She is able to hold the arm up against gravity.  Neurological: She is alert and oriented to person, place, and time.  Follows commands, Moves all extremities  5/5 strength to RUE and BLE, slightly limited left grip strength secondary to patient's pain (when I encourage her to grip despite the pain, she is able to have symmetric strength but states that it hurts) No weakness noted of the left upper extremity. Sensation intact throughout all major nerve distributions  Skin: Skin is warm and dry.  Capillary refill takes less than 2 seconds.  No rash noted.  Psychiatric: She has a normal mood and affect. Her speech is normal.  Nursing note and vitals reviewed.    ED Treatments / Results  Labs (all labs ordered are listed, but only abnormal results are displayed) Labs Reviewed - No data to display  EKG  EKG Interpretation None       Radiology No results found.  Procedures Procedures (including critical care time)  Medications Ordered in ED Medications  HYDROcodone-acetaminophen (NORCO/VICODIN) 5-325 MG per tablet 2 tablet (2 tablets Oral Given 07/19/17 1641)     Initial Impression / Assessment and Plan / ED Course  I have reviewed the triage vital signs and the nursing notes.  Pertinent labs & imaging results that were available during my care of the patient were reviewed by me and considered in my medical decision making (see chart for details).     47 year old female who presents for evaluation of chronic left shoulder and neck pain.  Has had this as an ongoing issue for the last several years but states that she has intermittent flares.   Most recent flare started 3 weeks ago.  Seen in the ED 2 weeks ago for evaluation of same symptoms.  Given pain medication.  Told to follow-up with neurosurgeon but she did not call them.  Was referred to pain management by her doctor but has not seen them yet.  Comes in today with persistent pain.  No new changes or worsening of symptoms but states that the pain is continued to persist despite interventions.  No new trauma, injury.  No red flag symptoms noted.  No neuro deficits noted on exam.  No rash noted.  Patient is afebrile, non-toxic appearing, sitting comfortably on examination table. Vital signs reviewed and stable. Suspect that this is likely secondary to chronic radiculopathy pain.  History/physical exam is not concerning for ACS etiology, shingles, CVA, spinal abscess, cauda equina.  Given that patient denies any new or changes in symptoms or any new trauma, injury, fall, no indication for repeat imaging at this time.  Additionally, patient is not tender in the midline C-spine I would indicate acute abnormality.  We will plan to start patient on short course of gabapentin and have her follow-up with outpatient neurosurgery for further evaluation.  I discussed plan with patient and she is agreeable. Patient had ample opportunity for questions and discussion. All patient's questions were answered with full understanding. Strict return precautions discussed. Patient expresses understanding and agreement to plan.   Final Clinical Impressions(s) / ED Diagnoses   Final diagnoses:  Radiculopathy, unspecified spinal region    ED Discharge Orders        Ordered    gabapentin (NEURONTIN) 100 MG capsule  3 times daily     07/19/17 1652       Rosana Hoes 07/19/17 2237    Maia Plan, MD 07/20/17 [phone]

## 2017-07-19 NOTE — Discharge Instructions (Signed)
You can take Tylenol or Ibuprofen as directed for pain. You can alternate Tylenol and Ibuprofen every 4 hours. If you take Tylenol at 1pm, then you can take Ibuprofen at 5pm. Then you can take Tylenol again at 9pm.   Take the Gabapentin as directed for pain.   As we discussed, follow-up with the referred Neurosurgeon for further evaluation.   Return to the Emergency Department for any worsening, pain, fever, inability to move or feel your arm, speech difficulty, vision changes, chest pain,

## 2017-07-19 NOTE — ED Triage Notes (Signed)
Pt c/o chronic shoulder pain , seen here several times for same, PMD referred to pain clinic , pt has not gone to pain clinic

## 2017-12-22 MED ORDER — PANTOPRAZOLE SODIUM 40 MG PO TBEC
40.00 | DELAYED_RELEASE_TABLET | ORAL | Status: DC
Start: 2017-12-23 — End: 2017-12-22

## 2017-12-22 MED ORDER — AMPHETAMINE-DEXTROAMPHETAMINE 20 MG PO TABS
20.00 | ORAL_TABLET | ORAL | Status: DC
Start: 2017-12-23 — End: 2017-12-22

## 2017-12-22 MED ORDER — BISACODYL 10 MG RE SUPP
10.00 | RECTAL | Status: DC
Start: ? — End: 2017-12-22

## 2017-12-22 MED ORDER — GUAIFENESIN-DM 100-10 MG/5ML PO SYRP
5.00 | ORAL_SOLUTION | ORAL | Status: DC
Start: ? — End: 2017-12-22

## 2017-12-22 MED ORDER — DIPHENHYDRAMINE HCL 25 MG PO CAPS
25.00 | ORAL_CAPSULE | ORAL | Status: DC
Start: ? — End: 2017-12-22

## 2017-12-22 MED ORDER — TIZANIDINE HCL 4 MG PO TABS
2.00 | ORAL_TABLET | ORAL | Status: DC
Start: ? — End: 2017-12-22

## 2017-12-22 MED ORDER — HYDROCODONE-ACETAMINOPHEN 5-325 MG PO TABS
1.00 | ORAL_TABLET | ORAL | Status: DC
Start: ? — End: 2017-12-22

## 2017-12-22 MED ORDER — ALUMINUM-MAGNESIUM-SIMETHICONE 200-200-20 MG/5ML PO SUSP
30.00 | ORAL | Status: DC
Start: ? — End: 2017-12-22

## 2017-12-22 MED ORDER — ENOXAPARIN SODIUM 40 MG/0.4ML ~~LOC~~ SOLN
40.00 | SUBCUTANEOUS | Status: DC
Start: 2017-12-22 — End: 2017-12-22

## 2017-12-22 MED ORDER — ASPIRIN EC 81 MG PO TBEC
81.00 | DELAYED_RELEASE_TABLET | ORAL | Status: DC
Start: 2017-12-23 — End: 2017-12-22

## 2017-12-22 MED ORDER — DOCUSATE SODIUM 100 MG PO CAPS
100.00 | ORAL_CAPSULE | ORAL | Status: DC
Start: 2017-12-22 — End: 2017-12-22

## 2017-12-22 MED ORDER — ZOLPIDEM TARTRATE 5 MG PO TABS
5.00 | ORAL_TABLET | ORAL | Status: DC
Start: ? — End: 2017-12-22

## 2017-12-22 MED ORDER — ACETAMINOPHEN 325 MG PO TABS
650.00 | ORAL_TABLET | ORAL | Status: DC
Start: ? — End: 2017-12-22

## 2017-12-22 MED ORDER — HYDRALAZINE HCL 10 MG PO TABS
10.00 | ORAL_TABLET | ORAL | Status: DC
Start: ? — End: 2017-12-22

## 2017-12-22 MED ORDER — POLYETHYLENE GLYCOL 3350 17 G PO PACK
17.00 g | PACK | ORAL | Status: DC
Start: 2017-12-23 — End: 2017-12-22

## 2017-12-22 MED ORDER — PROMETHAZINE HCL 12.5 MG PO TABS
12.50 | ORAL_TABLET | ORAL | Status: DC
Start: ? — End: 2017-12-22

## 2017-12-22 MED ORDER — TRAMADOL HCL 50 MG PO TABS
50.00 | ORAL_TABLET | ORAL | Status: DC
Start: ? — End: 2017-12-22

## 2017-12-22 MED ORDER — SORBITOL 70 % RE SOLN
30.00 | RECTAL | Status: DC
Start: ? — End: 2017-12-22

## 2017-12-22 MED ORDER — DICLOFENAC POTASSIUM 50 MG PO PACK
1.00 | PACK | ORAL | Status: DC
Start: ? — End: 2017-12-22

## 2017-12-22 MED ORDER — CLONIDINE HCL 0.1 MG PO TABS
0.10 | ORAL_TABLET | ORAL | Status: DC
Start: ? — End: 2017-12-22

## 2018-01-02 ENCOUNTER — Inpatient Hospital Stay: Payer: BLUE CROSS/BLUE SHIELD | Attending: Hematology & Oncology

## 2018-01-02 ENCOUNTER — Inpatient Hospital Stay: Payer: BLUE CROSS/BLUE SHIELD | Admitting: Hematology & Oncology

## 2018-10-13 ENCOUNTER — Emergency Department (HOSPITAL_BASED_OUTPATIENT_CLINIC_OR_DEPARTMENT_OTHER): Payer: BLUE CROSS/BLUE SHIELD

## 2018-10-13 ENCOUNTER — Encounter (HOSPITAL_BASED_OUTPATIENT_CLINIC_OR_DEPARTMENT_OTHER): Payer: Self-pay | Admitting: Emergency Medicine

## 2018-10-13 ENCOUNTER — Other Ambulatory Visit: Payer: Self-pay

## 2018-10-13 ENCOUNTER — Emergency Department (HOSPITAL_BASED_OUTPATIENT_CLINIC_OR_DEPARTMENT_OTHER)
Admission: EM | Admit: 2018-10-13 | Discharge: 2018-10-13 | Disposition: A | Discharge status: Home / Self Care | Payer: BLUE CROSS/BLUE SHIELD | Attending: Emergency Medicine | Admitting: Emergency Medicine

## 2018-10-13 DIAGNOSIS — R0602 Shortness of breath: Secondary | ICD-10-CM | POA: Insufficient documentation

## 2018-10-13 DIAGNOSIS — R51 Headache: Secondary | ICD-10-CM | POA: Insufficient documentation

## 2018-10-13 DIAGNOSIS — R0682 Tachypnea, not elsewhere classified: Secondary | ICD-10-CM | POA: Insufficient documentation

## 2018-10-13 DIAGNOSIS — R112 Nausea with vomiting, unspecified: Secondary | ICD-10-CM | POA: Diagnosis not present

## 2018-10-13 DIAGNOSIS — M79606 Pain in leg, unspecified: Secondary | ICD-10-CM

## 2018-10-13 DIAGNOSIS — M79652 Pain in left thigh: Secondary | ICD-10-CM

## 2018-10-13 DIAGNOSIS — R42 Dizziness and giddiness: Secondary | ICD-10-CM | POA: Diagnosis not present

## 2018-10-13 DIAGNOSIS — Z79899 Other long term (current) drug therapy: Secondary | ICD-10-CM | POA: Diagnosis not present

## 2018-10-13 LAB — CBC WITH DIFFERENTIAL/PLATELET
Abs Immature Granulocytes: 0.03 10*3/uL (ref 0.00–0.07)
Basophils Absolute: 0 10*3/uL (ref 0.0–0.1)
Basophils Relative: 0 %
Eosinophils Absolute: 0.2 10*3/uL (ref 0.0–0.5)
Eosinophils Relative: 2 %
HCT: 37.7 % (ref 36.0–46.0)
Hemoglobin: 11.8 g/dL — ABNORMAL LOW (ref 12.0–15.0)
Immature Granulocytes: 0 %
Lymphocytes Relative: 39 %
Lymphs Abs: 3.7 10*3/uL (ref 0.7–4.0)
MCH: 27.8 pg (ref 26.0–34.0)
MCHC: 31.3 g/dL (ref 30.0–36.0)
MCV: 88.7 fL (ref 80.0–100.0)
Monocytes Absolute: 0.8 10*3/uL (ref 0.1–1.0)
Monocytes Relative: 8 %
Neutro Abs: 4.7 10*3/uL (ref 1.7–7.7)
Neutrophils Relative %: 51 %
Platelets: 318 10*3/uL (ref 150–400)
RBC: 4.25 MIL/uL (ref 3.87–5.11)
RDW: 14.4 % (ref 11.5–15.5)
WBC: 9.4 10*3/uL (ref 4.0–10.5)
nRBC: 0 % (ref 0.0–0.2)

## 2018-10-13 LAB — COMPREHENSIVE METABOLIC PANEL
ALT: 30 U/L (ref 0–44)
AST: 22 U/L (ref 15–41)
Albumin: 3.6 g/dL (ref 3.5–5.0)
Alkaline Phosphatase: 104 U/L (ref 38–126)
Anion gap: 8 (ref 5–15)
BUN: 16 mg/dL (ref 6–20)
CO2: 26 mmol/L (ref 22–32)
Calcium: 8.9 mg/dL (ref 8.9–10.3)
Chloride: 106 mmol/L (ref 98–111)
Creatinine, Ser: 0.88 mg/dL (ref 0.44–1.00)
GFR calc Af Amer: >60 mL/min (ref >60)
GFR calc non Af Amer: >60 mL/min (ref >60)
Glucose, Bld: 104 mg/dL — ABNORMAL HIGH (ref 70–99)
Potassium: 3.3 mmol/L — ABNORMAL LOW (ref 3.5–5.1)
Sodium: 140 mmol/L (ref 135–145)
Total Bilirubin: 0.5 mg/dL (ref 0.3–1.2)
Total Protein: 7.7 g/dL (ref 6.5–8.1)

## 2018-10-13 LAB — TROPONIN I
Troponin I: <0.03 ng/mL (ref <0.03)
Troponin I: <0.03 ng/mL (ref <0.03)

## 2018-10-13 LAB — LIPASE, BLOOD: Lipase: 24 U/L (ref 11–51)

## 2018-10-13 MED ORDER — SODIUM CHLORIDE 0.9 % IV BOLUS
500.0000 mL | Freq: Once | INTRAVENOUS | Status: AC
  Administered 2018-10-13: 20:00:00 500 mL via INTRAVENOUS

## 2018-10-13 MED ORDER — ONDANSETRON HCL 4 MG/2ML IJ SOLN
4.0000 mg | Freq: Once | INTRAMUSCULAR | Status: AC
  Administered 2018-10-13: 18:00:00 4 mg via INTRAVENOUS
  Filled 2018-10-13: qty 2

## 2018-10-13 MED ORDER — IOHEXOL 350 MG/ML SOLN
100.0000 mL | Freq: Once | INTRAVENOUS | Status: AC
  Administered 2018-10-13: 100 mL via INTRAVENOUS

## 2018-10-13 MED ORDER — ACETAMINOPHEN 325 MG PO TABS
650.0000 mg | ORAL_TABLET | Freq: Once | ORAL | Status: AC
  Administered 2018-10-13: 21:00:00 650 mg via ORAL
  Filled 2018-10-13: qty 2

## 2018-10-13 NOTE — ED Notes (Signed)
Pt states that with the IV fluids her dizziness has resolved. Pt reports a HA at this time.

## 2018-10-13 NOTE — ED Triage Notes (Signed)
L thigh pain for several days with intermittent SOB. Hx of PE.

## 2018-10-13 NOTE — ED Notes (Signed)
Pt SpO2 dropped to 84% briefly. Pt became more ShOB and lightheaded. As this RN was preparing supplemental O2, pt's SpO2 returned to 98% and symptoms improved. Repeat EKG obtained and EDP notified.

## 2018-10-13 NOTE — Discharge Instructions (Addendum)
As we discussed today your potassium was very slightly low at 3.2.  Please make sure that you are eating enough high potassium foods.  Please make sure you are taking plenty of fluids.   Your ultrasound did not show any evidence of clot in your leg.  Your skin on your chest did not show any evidence of PE or other acute abnormality.  Your heart enzyme was normal x2.

## 2018-10-13 NOTE — ED Provider Notes (Signed)
[facility] EMERGENCY DEPARTMENT Provider Note   CSN: [id number] Arrival date & time: 10/13/18  1727    History   Chief Complaint Chief Complaint  Patient presents with   Leg Pain   Shortness of Breath    HPI Rick Duffarisienne Gutman is a 48 y.o. female with a past medical history of PE, is not anticoagulated, who presents today for evaluation of left thigh pain for 3 days.  She reports that she initially felt like it was a soreness and ache however the pain has persisted.  She has also had shortness of breath intermittently for approximately 1 week.  She denies any left lower leg swelling.  She denies any chest pain.  She reports that today she has been nauseous and her symptoms have been getting worse and she is more short of breath.  She denies any fevers at home.  No cough.  She denies any coronavirus contacts.  She reports that she has been wearing a mask anytime she leaves home due to fears of COVID-19.  She denies any abdominal pain.  No recent trauma.     HPI  Past Medical History:  Diagnosis Date   Back pain    Endometriosis    Migraine    PE (pulmonary embolism)     Patient Active Problem List   Diagnosis Date Noted   Dyspnea 11/28/2014   Pulmonary embolism (HCC) 11/27/2014    Past Surgical History:  Procedure Laterality Date   ABDOMINAL HYSTERECTOMY     BUNIONECTOMY Right    CESAREAN SECTION     HERNIA REPAIR     LAPAROSCOPY       OB History   No obstetric history on file.      Home Medications    Prior to Admission medications   Medication Sig Start Date End Date Taking? Authorizing Provider  albuterol (PROVENTIL HFA;VENTOLIN HFA) 108 (90 BASE) MCG/ACT inhaler Inhale into the lungs. 11/30/14 11/30/15  [provider]  diphenhydrAMINE (BENADRYL) 25 MG tablet Take 25 mg by mouth at bedtime.      [provider]  gabapentin (NEURONTIN) 100 MG capsule Take 1 capsule (100 mg total) by mouth 3 (three) times daily for 14  days. 07/19/17 08/02/17  Maxwell CaulLayden, Lindsey A, PA-C  naproxen (NAPROSYN) 500 MG tablet Take 1 tablet (500 mg total) by mouth 2 (two) times daily. 06/24/17   Petrucelli, Samantha R, PA-C  tizanidine (ZANAFLEX) 2 MG capsule Take 2 mg by mouth 3 (three) times daily.    [provider]  Topiramate ER (TROKENDI XR) 100 MG CP24 Take 100 capsules by mouth daily.    [provider]  traMADol (ULTRAM) 50 MG tablet Take 1 tablet (50 mg total) by mouth every 6 (six) hours as needed. 06/24/17   Petrucelli, Pleas KochSamantha R, PA-C    Family History No family history on file.  Social History Social History   Tobacco Use   Smoking status: Never Smoker   Smokeless tobacco: Never Used  Substance Use Topics   Alcohol use: No    Alcohol/week: 0.0 standard drinks   Drug use: No     Allergies   Patient has no known allergies.   Review of Systems Review of Systems  Constitutional: Negative for chills and fever.  Respiratory: Positive for shortness of breath. Negative for cough, chest tightness and wheezing.   Cardiovascular: Negative for chest pain, palpitations and leg swelling.       Left thigh pain  Gastrointestinal: Positive for nausea and vomiting.  Negative for abdominal pain, constipation and diarrhea.  Musculoskeletal: Negative for back pain and neck pain.  Neurological: Negative for weakness and headaches.  All other systems reviewed and are negative.    Physical Exam Updated Vital Signs BP 117/62    Pulse 78    Temp 98.3 F (36.8 C) (Oral)    Resp 19    SpO2 100%   Physical Exam Vitals signs and nursing note reviewed.  Constitutional:      General: She is not in acute distress.    Appearance: She is well-developed. She is not diaphoretic.     Comments: Vomiting during exam.   HENT:     Head: Normocephalic and atraumatic.     Mouth/Throat:     Mouth: Mucous membranes are moist.  Eyes:     General: No scleral icterus.       Right eye: No discharge.        Left eye: No  discharge.     Conjunctiva/sclera: Conjunctivae normal.  Neck:     Musculoskeletal: Normal range of motion and neck supple.     Vascular: No JVD.  Cardiovascular:     Rate and Rhythm: Normal rate and regular rhythm.     Pulses: No decreased pulses.     Heart sounds: No murmur.  Pulmonary:     Effort: Pulmonary effort is normal. Tachypnea present. No accessory muscle usage or respiratory distress.     Breath sounds: Normal breath sounds. No stridor. No decreased breath sounds, wheezing, rhonchi or rales.  Chest:     Chest wall: No tenderness.  Abdominal:     General: There is no distension.     Palpations: Abdomen is soft.     Tenderness: There is no abdominal tenderness.  Musculoskeletal: Normal range of motion.        General: No deformity.     Right lower leg: She exhibits no tenderness. No edema.     Left lower leg: She exhibits no tenderness. No edema.  Skin:    General: Skin is warm and dry.  Neurological:     General: No focal deficit present.     Mental Status: She is alert.     Cranial Nerves: No cranial nerve deficit.     Motor: No weakness or abnormal muscle tone.  Psychiatric:        Behavior: Behavior normal.      ED Treatments / Results  Labs (all labs ordered are listed, but only abnormal results are displayed) Labs Reviewed  CBC WITH DIFFERENTIAL/PLATELET - Abnormal; Notable for the following components:      Result Value   Hemoglobin 11.8 (*)    All other components within normal limits  COMPREHENSIVE METABOLIC PANEL - Abnormal; Notable for the following components:   Potassium 3.3 (*)    Glucose, Bld 104 (*)    All other components within normal limits  LIPASE, BLOOD  TROPONIN I  TROPONIN I    EKG EKG Interpretation  Date/Time:  Saturday Oct 13 2018 17:44:05 EDT Ventricular Rate:  98 PR Interval:    QRS Duration: 88 QT Interval:  363 QTC Calculation: 464 R Axis:   56 Text Interpretation:  Sinus rhythm Low voltage, precordial leads  Borderline T abnormalities, diffuse leads flipped t wave in lead v3 seen on prior Confirmed by Melene Plan [phone]) on 10/13/2018 6:59:48 PM   EKG Interpretation  Date/Time:  Saturday Oct 13 2018 18:49:05 EDT Ventricular Rate:  88 PR Interval:    QRS Duration:  97 QT Interval:  408 QTC Calculation: 494 R Axis:   68 Text Interpretation:  Sinus rhythm Low voltage, precordial leads Borderline T abnormalities, diffuse leads Borderline prolonged QT interval No significant change since last tracing Confirmed by Melene Plan [phone]) on 10/13/2018 7:00:14 PM        Radiology Dg Chest 2 View  Result Date: 10/13/2018 CLINICAL DATA:  Left thigh pain for several days, shortness of breath EXAM: CHEST - 2 VIEW COMPARISON:  03/29/2016 FINDINGS: There is no focal parenchymal opacity. There is no pleural effusion or pneumothorax. The heart and mediastinal contours are unremarkable. The osseous structures are unremarkable. IMPRESSION: No active cardiopulmonary disease. Electronically Signed   By: Elige Ko   On: 10/13/2018 18:53   Ct Angio Chest Pe W/cm &/or Wo Cm  Result Date: 10/13/2018 CLINICAL DATA:  PE suspected. High pretest probability. EXAM: CT ANGIOGRAPHY CHEST WITH CONTRAST TECHNIQUE: Multidetector CT imaging of the chest was performed using the standard protocol during bolus administration of intravenous contrast. Multiplanar CT image reconstructions and MIPs were obtained to evaluate the vascular anatomy. CONTRAST:  OMNIPAQUE IOHEXOL 350 MG/ML SOLN COMPARISON:  Chest CT angiogram dated 03/06/2015. FINDINGS: Cardiovascular: Some of the most peripheral segmental and subsegmental pulmonary artery branches cannot be definitively characterized due to patient breathing motion artifact, however, there is no pulmonary embolism identified within the main, lobar or central segmental pulmonary arteries bilaterally. No thoracic aortic aneurysm or evidence of aortic dissection. No pericardial effusion.  Mediastinum/Nodes: No mass or enlarged lymph nodes seen within the mediastinum or perihilar regions. Esophagus appears normal. Trachea and central bronchi are unremarkable. Lungs/Pleura: Lungs are clear. No pleural effusion or pneumothorax. Upper Abdomen: Limited images of the upper abdomen are unremarkable. Musculoskeletal: No acute or suspicious osseous finding. No chest wall abnormality. Review of the MIP images confirms the above findings. IMPRESSION: No acute findings. No pulmonary embolism seen, with mild study limitations detailed above. Lungs are clear. Electronically Signed   By: Bary Richard M.D.   On: 10/13/2018 19:52   US Venous Img Lower Unilateral Left  Result Date: 10/13/2018 CLINICAL DATA:  Left thigh pain and shortness of breath EXAM: Left LOWER EXTREMITY VENOUS DOPPLER ULTRASOUND TECHNIQUE: Gray-scale sonography with graded compression, as well as color Doppler and duplex ultrasound were performed to evaluate the lower extremity deep venous systems from the level of the common femoral vein and including the common femoral, femoral, profunda femoral, popliteal and calf veins including the posterior tibial, peroneal and gastrocnemius veins when visible. The superficial great saphenous vein was also interrogated. Spectral Doppler was utilized to evaluate flow at rest and with distal augmentation maneuvers in the common femoral, femoral and popliteal veins. COMPARISON:  None. FINDINGS: Contralateral Common Femoral Vein: Respiratory phasicity is normal and symmetric with the symptomatic side. No evidence of thrombus. Normal compressibility. Common Femoral Vein: No evidence of thrombus. Normal compressibility, respiratory phasicity and response to augmentation. Saphenofemoral Junction: No evidence of thrombus. Normal compressibility and flow on color Doppler imaging. Profunda Femoral Vein: No evidence of thrombus. Normal compressibility and flow on color Doppler imaging. Femoral Vein: No evidence of  thrombus. Normal compressibility, respiratory phasicity and response to augmentation. Popliteal Vein: No evidence of thrombus. Normal compressibility, respiratory phasicity and response to augmentation. Calf Veins: No evidence of thrombus. Normal compressibility and flow on color Doppler imaging. Superficial Great Saphenous Vein: No evidence of thrombus. Normal compressibility. Venous Reflux:  None. Other Findings:  None. IMPRESSION: No evidence of deep venous thrombosis. Electronically Signed  By: Katherine Mantle M.D.   On: 10/13/2018 18:56    Procedures Procedures (including critical care time)  Medications Ordered in ED Medications  ondansetron (ZOFRAN) injection 4 mg (4 mg Intravenous Given 10/13/18 1804)  iohexol (OMNIPAQUE) 350 MG/ML injection 100 mL (100 mLs Intravenous Contrast Given 10/13/18 1923)  sodium chloride 0.9 % bolus 500 mL (0 mLs Intravenous Stopped 10/13/18 2114)  acetaminophen (TYLENOL) tablet 650 mg (650 mg Oral Given 10/13/18 2115)     Initial Impression / Assessment and Plan / ED Course  I have reviewed the triage vital signs and the nursing notes.  Pertinent labs & imaging results that were available during my care of the patient were reviewed by me and considered in my medical decision making (see chart for details).  Clinical Course as of Oct 12 2217  Sat Oct 13, 2018  1900 Was informed by RN that patient desaturated into the 80s.  She took her mask up and sat up after which this resolved.   [EH]  2203 Patient re-evaluated and feels much better.    [EH]    Clinical Course User Index [EH] Cristina Gong, PA-C      Patient presents today for evaluation of left thigh pain and shortness of breath.  She has a history of PE however is not on any anticoagulation at this time.  She reports no chest pain or leg swelling.  Given her history left leg DVT study was obtained without evidence of DVT or other abnormality.  Labs are obtained and reviewed, her  potassium is slightly low at 3.2, however she is without other significant hematologic or electrolyte derangements.  EKG x2 without evidence of ischemia.  Troponin x2 without elevation.  Based on her history, and shortness of breath we discussed additional evaluation options and she elected for CT PE study.  This was performed without evidence of acute abnormality, PE, pulmonary edema, or consolidation.  Patient was treated with 500 cc of IV fluids while in the department, after which she reports that she felt much better.  Recommended high potassium foods.  She was offered COVID-19 testing however declined stating that she wished to follow-up with her primary care doctor and her hematologist instead.    Return precautions were discussed with patient who states their understanding.  At the time of discharge patient denied any unaddressed complaints or concerns.  Patient is agreeable for discharge home.   Final Clinical Impressions(s) / ED Diagnoses   Final diagnoses:  Left thigh pain  Shortness of breath    ED Discharge Orders    None       Norman Clay 10/13/18 2222    Melene Plan, DO 10/13/18 2253

## 2018-10-13 NOTE — ED Notes (Signed)
Patient transported to CT 

## 2019-02-22 ENCOUNTER — Emergency Department (HOSPITAL_BASED_OUTPATIENT_CLINIC_OR_DEPARTMENT_OTHER)
Admission: EM | Admit: 2019-02-22 | Discharge: 2019-02-23 | Disposition: A | Discharge status: Home / Self Care | Payer: Self-pay | Attending: Emergency Medicine | Admitting: Emergency Medicine

## 2019-02-22 ENCOUNTER — Encounter (HOSPITAL_BASED_OUTPATIENT_CLINIC_OR_DEPARTMENT_OTHER): Payer: Self-pay | Admitting: Adult Health

## 2019-02-22 ENCOUNTER — Emergency Department (HOSPITAL_BASED_OUTPATIENT_CLINIC_OR_DEPARTMENT_OTHER): Payer: Self-pay

## 2019-02-22 ENCOUNTER — Other Ambulatory Visit: Payer: Self-pay

## 2019-02-22 DIAGNOSIS — R0789 Other chest pain: Secondary | ICD-10-CM | POA: Insufficient documentation

## 2019-02-22 DIAGNOSIS — Z7982 Long term (current) use of aspirin: Secondary | ICD-10-CM | POA: Insufficient documentation

## 2019-02-22 DIAGNOSIS — R072 Precordial pain: Secondary | ICD-10-CM | POA: Insufficient documentation

## 2019-02-22 DIAGNOSIS — Z79899 Other long term (current) drug therapy: Secondary | ICD-10-CM | POA: Insufficient documentation

## 2019-02-22 LAB — HEPATIC FUNCTION PANEL
ALT: 22 U/L (ref 0–44)
AST: 23 U/L (ref 15–41)
Albumin: 3.8 g/dL (ref 3.5–5.0)
Alkaline Phosphatase: 110 U/L (ref 38–126)
Bilirubin, Direct: <0.1 mg/dL (ref 0.0–0.2)
Total Bilirubin: 0.6 mg/dL (ref 0.3–1.2)
Total Protein: 8.4 g/dL — ABNORMAL HIGH (ref 6.5–8.1)

## 2019-02-22 LAB — CBC
HCT: 41.6 % (ref 36.0–46.0)
Hemoglobin: 13.1 g/dL (ref 12.0–15.0)
MCH: 27.7 pg (ref 26.0–34.0)
MCHC: 31.5 g/dL (ref 30.0–36.0)
MCV: 87.9 fL (ref 80.0–100.0)
Platelets: 351 10*3/uL (ref 150–400)
RBC: 4.73 MIL/uL (ref 3.87–5.11)
RDW: 14.9 % (ref 11.5–15.5)
WBC: 9.2 10*3/uL (ref 4.0–10.5)
nRBC: 0 % (ref 0.0–0.2)

## 2019-02-22 LAB — BASIC METABOLIC PANEL
Anion gap: 11 (ref 5–15)
BUN: 12 mg/dL (ref 6–20)
CO2: 23 mmol/L (ref 22–32)
Calcium: 9.7 mg/dL (ref 8.9–10.3)
Chloride: 105 mmol/L (ref 98–111)
Creatinine, Ser: 0.99 mg/dL (ref 0.44–1.00)
GFR calc Af Amer: >60 mL/min (ref >60)
GFR calc non Af Amer: >60 mL/min (ref >60)
Glucose, Bld: 107 mg/dL — ABNORMAL HIGH (ref 70–99)
Potassium: 3.9 mmol/L (ref 3.5–5.1)
Sodium: 139 mmol/L (ref 135–145)

## 2019-02-22 LAB — TROPONIN I (HIGH SENSITIVITY)
Troponin I (High Sensitivity): <2 ng/L (ref <18)
Troponin I (High Sensitivity): <2 ng/L (ref <18)

## 2019-02-22 LAB — PREGNANCY, URINE: Preg Test, Ur: NEGATIVE

## 2019-02-22 LAB — LIPASE, BLOOD: Lipase: 24 U/L (ref 11–51)

## 2019-02-22 MED ORDER — SODIUM CHLORIDE 0.9% FLUSH
3.0000 mL | Freq: Once | INTRAVENOUS | Status: DC
  Filled 2019-02-22: qty 3

## 2019-02-22 MED ORDER — OXYCODONE-ACETAMINOPHEN 5-325 MG PO TABS: 1.0000 | ORAL_TABLET | ORAL | 0 refills | Status: DC | PRN

## 2019-02-22 MED ORDER — OXYCODONE-ACETAMINOPHEN 5-325 MG PO TABS
1.0000 | ORAL_TABLET | Freq: Once | ORAL | Status: AC
  Administered 2019-02-22: 1 via ORAL
  Filled 2019-02-22: qty 1

## 2019-02-22 MED ORDER — IOHEXOL 350 MG/ML SOLN
75.0000 mL | Freq: Once | INTRAVENOUS | Status: AC | PRN
  Administered 2019-02-22: 75 mL via INTRAVENOUS

## 2019-02-22 NOTE — ED Provider Notes (Signed)
[facility]ERGENCY DEPARTMENT Provider Note   CSN: [id number] Arrival date & time: 02/22/19  1947     History   Chief Complaint Chief Complaint  Patient presents with   Shortness of Breath    HPI Ellen Hood is a 48 y.o. female.     The history is provided by the patient and medical records. No language interpreter was used.  Shortness of Breath Severity:  Severe Onset quality:  Gradual Duration:  3 days Timing:  Constant Progression:  Waxing and waning Chronicity:  New Relieved by:  Nothing Worsened by:  Nothing Ineffective treatments:  None tried Associated symptoms: chest pain and vomiting   Associated symptoms: no abdominal pain, no claudication, no cough, no diaphoresis, no fever, no headaches, no neck pain, no rash, no sputum production and no wheezing     Past Medical History:  Diagnosis Date   Back pain    Endometriosis    Migraine    PE (pulmonary embolism)     Patient Active Problem List   Diagnosis Date Noted   Dyspnea 11/28/2014   Pulmonary embolism ([place]) 11/27/2014    Past Surgical History:  Procedure Laterality Date   ABDOMINAL HYSTERECTOMY     BUNIONECTOMY Right    CESAREAN SECTION     HERNIA REPAIR     LAPAROSCOPY       OB History   No obstetric history on file.      Home Medications    Prior to Admission medications   Medication Sig Start Date End Date Taking? Authorizing Provider  albuterol (PROVENTIL HFA;VENTOLIN HFA) 108 (90 BASE) MCG/ACT inhaler Inhale into the lungs. 11/30/14 11/30/15  [provider]  diphenhydrAMINE (BENADRYL) 25 MG tablet Take 25 mg by mouth at bedtime.      [provider]  gabapentin (NEURONTIN) 100 MG capsule Take 1 capsule (100 mg total) by mouth 3 (three) times daily for 14 days. 07/19/17 08/02/17  Volanda Napoleon, PA-C  naproxen (NAPROSYN) 500 MG tablet Take 1 tablet (500 mg total) by mouth 2 (two) times daily. 06/24/17   Petrucelli, Samantha R, PA-C    tizanidine (ZANAFLEX) 2 MG capsule Take 2 mg by mouth 3 (three) times daily.    [provider]  Topiramate ER (TROKENDI XR) 100 MG CP24 Take 100 capsules by mouth daily.    [provider]  traMADol (ULTRAM) 50 MG tablet Take 1 tablet (50 mg total) by mouth every 6 (six) hours as needed. 06/24/17   Petrucelli, Glynda Jaeger, PA-C    Family History History reviewed. No pertinent family history.  Social History Social History   Tobacco Use   Smoking status: Never Smoker   Smokeless tobacco: Never Used  Substance Use Topics   Alcohol use: No    Alcohol/week: 0.0 standard drinks   Drug use: No     Allergies   Patient has no known allergies.   Review of Systems Review of Systems  Constitutional: Negative for chills, diaphoresis, fatigue and fever.  HENT: Negative for congestion and rhinorrhea.   Respiratory: Positive for shortness of breath. Negative for cough, sputum production, chest tightness and wheezing.   Cardiovascular: Positive for chest pain. Negative for palpitations and claudication.  Gastrointestinal: Positive for nausea and vomiting. Negative for abdominal pain, constipation and diarrhea.  Genitourinary: Negative for flank pain and frequency.  Musculoskeletal: Negative for back pain, neck pain and neck stiffness.  Skin: Negative for rash and wound.  Neurological: Negative for light-headedness and headaches.  Psychiatric/Behavioral: Negative  for agitation and confusion.  All other systems reviewed and are negative.    Physical Exam Updated Vital Signs BP 134/78 (BP Location: Right Arm)    Pulse 90    Temp (!) 97 F (36.1 C) (Oral)    Resp (!) 22    Ht 5' 5.5" (1.664 m)    Wt 86.2 kg    SpO2 100%    BMI 31.14 kg/m   Physical Exam Vitals signs and nursing note reviewed.  Constitutional:      General: She is not in acute distress.    Appearance: She is well-developed. She is not ill-appearing, toxic-appearing or diaphoretic.      Interventions: She is not intubated. HENT:     Head: Normocephalic and atraumatic.     Right Ear: External ear normal.     Left Ear: External ear normal.     Nose: Nose normal.     Mouth/Throat:     Pharynx: No oropharyngeal exudate.  Eyes:     Conjunctiva/sclera: Conjunctivae normal.     Pupils: Pupils are equal, round, and reactive to light.  Neck:     Musculoskeletal: Normal range of motion and neck supple.  Pulmonary:     Effort: Pulmonary effort is normal. Tachypnea present. No respiratory distress. She is not intubated.     Breath sounds: No stridor. No decreased breath sounds, wheezing, rhonchi or rales.  Chest:     Chest wall: Tenderness present. No mass or edema.  Abdominal:     General: There is no distension.     Tenderness: There is no abdominal tenderness. There is no rebound.  Musculoskeletal:     Right lower leg: She exhibits no tenderness. No edema.     Left lower leg: She exhibits no tenderness. No edema.  Skin:    General: Skin is warm.     Capillary Refill: Capillary refill takes less than 2 seconds.     Findings: No erythema or rash.  Neurological:     General: No focal deficit present.     Mental Status: She is alert and oriented to person, place, and time.     Motor: No abnormal muscle tone.     Coordination: Coordination normal.     Deep Tendon Reflexes: Reflexes are normal and symmetric.  Psychiatric:        Mood and Affect: Mood normal.      ED Treatments / Results  Labs (all labs ordered are listed, but only abnormal results are displayed) Labs Reviewed  BASIC METABOLIC PANEL - Abnormal; Notable for the following components:      Result Value   Glucose, Bld 107 (*)    All other components within normal limits  HEPATIC FUNCTION PANEL - Abnormal; Notable for the following components:   Total Protein 8.4 (*)    All other components within normal limits  CBC  PREGNANCY, URINE  LIPASE, BLOOD  TROPONIN I (HIGH SENSITIVITY)  TROPONIN I (HIGH  SENSITIVITY)    EKG EKG Interpretation  Date/Time:  Friday February 22 2019 19:58:38 EDT Ventricular Rate:  92 PR Interval:  126 QRS Duration: 80 QT Interval:  386 QTC Calculation: 477 R Axis:   55 Text Interpretation:  Normal sinus rhythm Nonspecific T wave abnormality Abnormal ECG When compared to prior, no significnat changes seen.  No STEMI Confirmed by Theda Belfast ([id number]) on 02/22/2019 8:22:30 PM   Radiology Dg Chest 2 View  Result Date: 02/22/2019 CLINICAL DATA:  Shortness of breath EXAM: CHEST - 2 VIEW  COMPARISON:  10/13/2018 FINDINGS: The heart size and mediastinal contours are within normal limits. Both lungs are clear. The visualized skeletal structures are unremarkable. IMPRESSION: No acute abnormality of the lungs. Electronically Signed   By: Lauralyn Primes M.D.   On: 02/22/2019 20:43   Ct Angio Chest Pe W And/or Wo Contrast  Result Date: 02/22/2019 CLINICAL DATA:  PE suspected, high pretest probability, history of PE 4 years prior. Hypo ventilating. This is is his he left his chest EXAM: CT ANGIOGRAPHY CHEST WITH CONTRAST TECHNIQUE: Multidetector CT imaging of the chest was performed using the standard protocol during bolus administration of intravenous contrast. Multiplanar CT image reconstructions and MIPs were obtained to evaluate the vascular anatomy. CONTRAST:  2mL OMNIPAQUE IOHEXOL 350 MG/ML SOLN COMPARISON:  CTA Oct 13, 2018 FINDINGS: Cardiovascular: Satisfactory opacification of the pulmonary arteries to the segmental level. No acute or chronic pulmonary arterial filling defects are identified. Central pulmonary artery caliber is normal. The aorta is normal caliber. No acute luminal irregularity of the aorta. No periaortic stranding. Normal heart size. No pericardial effusion. Mediastinum/Nodes: No enlarged mediastinal or axillary lymph nodes. Thyroid gland, trachea, and esophagus demonstrate no significant findings. Lungs/Pleura: Dependent atelectasis posteriorly. More  bandlike areas of opacity in the left lower lobe and right middle lobe may reflect subsegmental atelectasis or scarring. No consolidation, features of edema, pneumothorax, or effusion. No suspicious pulmonary nodules or masses. Upper Abdomen: No acute abnormalities present in the visualized portions of the upper abdomen. Musculoskeletal: No chest wall abnormality. No acute or significant osseous findings. Review of the MIP images confirms the above findings. IMPRESSION: No evidence of acute or chronic pulmonary embolism. No acute intrathoracic process. Electronically Signed   By: Kreg Shropshire M.D.   On: 02/22/2019 22:16    Procedures Procedures (including critical care time)  Medications Ordered in ED Medications  sodium chloride flush (NS) 0.9 % injection 3 mL (has no administration in time range)  oxyCODONE-acetaminophen (PERCOCET/ROXICET) 5-325 MG per tablet 1 tablet (has no administration in time range)  iohexol (OMNIPAQUE) 350 MG/ML injection 75 mL (75 mLs Intravenous Contrast Given 02/22/19 2145)     Initial Impression / Assessment and Plan / ED Course  I have reviewed the triage vital signs and the nursing notes.  Pertinent labs & imaging results that were available during my care of the patient were reviewed by me and considered in my medical decision making (see chart for details).        Ellen Hood is a 48 y.o. female with a past medical history significant for migraines, endometriosis, and prior pulmonary embolism not currently on anticoagulation who presents with shortness of breath and chest pain.  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  She was on Xarelto but with insurance changed 2 years ago, she started taking just aspirin.  She reports has had no problems but over the last several days started having exertional shortness of breath.  She reports she was breathing quickly and could not catch her breath.  She then today started having  sudden onset of left-sided chest pain.  She reports the pain is extremely severe at times and is exertional as well.  She reports nausea and vomiting with the pain but denies diaphoresis.  She denies trauma.  She does report that she recently went on a plane to [facility] for a short flight but denied unilateral leg pain or leg swelling.  She denies fevers, chills, congestion, or cough.  She denies any medication  changes.  She denies other complaints.  On exam, patient does have tenderness in her left chest.  Patient is good pulses in all extremities.  Legs are nonedematous and nontender.  Patient is tachypneic on arrival however she is on room air with good oxygen saturations.  EKG shows no STEMI.      Clinical I am concerned patient may have recurrent pulmonary embolism versus other etiology such as musculoskeletal chest pain.  Will get CT PE study as she is high risk is all screening labs.  Anticipate reassessment after work-up and delta troponin.  11:37 PM Diagnostic work-up was overall reassuring.  No evidence of abnormality or PE on CT scan.  Delta troponin negative.  Other labs reassuring.  Patient is still tender, continues suspect musculoskeletal pain.  Patient reports she does take chronic muscle relaxant but would be understood and a prescription for some pain medicine for the next few days.  Patient will be given a dose of Percocet and will get a short prescription.  Patient will follow-up with her PCP and understands return precautions.  Patient with questions or concerns and was discharged in good condition.    Final Clinical Impressions(s) / ED Diagnoses   Final diagnoses:  Precordial pain  Chest wall pain    ED Discharge Orders         Ordered    oxyCODONE-acetaminophen (PERCOCET/ROXICET) 5-325 MG tablet  Every 4 hours PRN     02/22/19 2342          Clinical Impression: 1. Precordial pain   2. Chest wall pain     Disposition: Discharge  Condition: Good  I have  discussed the results, Dx and Tx plan with the pt(& family if present). He/she/they expressed understanding and agree(s) with the plan. Discharge instructions discussed at great length. Strict return precautions discussed and pt &/or family have verbalized understanding of the instructions. No further questions at time of discharge.    New Prescriptions   OXYCODONE-ACETAMINOPHEN (PERCOCET/ROXICET) 5-325 MG TABLET    Take 1 tablet by mouth every 4 (four) hours as needed.    Follow Up: Shellia CleverlyBeane, Lori M, PA [address] [place] [id number] [phone]     [facility] [address] [id number] [id number] [address] [place] [id number] [phone]       Tessah Patchen, Canary Brimhristopher J, MD 02/22/19 [phone]

## 2019-02-22 NOTE — ED Triage Notes (Addendum)
PT has a hx of PEs, 4 years ago.  She came in crying and hyperventilating. She was given a paper bag to breathe in to slow down. Sats 100%. She is calmer at this time. She reports that she has been having SOB for 2 days, today is the first day that began having Left sided chest pain. She no longer takes Xarelto and has been off of it for 2 years.

## 2019-02-22 NOTE — ED Notes (Signed)
Pt transported to radiology with rad tech at this time.

## 2019-02-22 NOTE — Discharge Instructions (Signed)
Your work-up today did not show evidence of blood clot or other acute abnormality causing your symptoms. Your EKG and cardiac enzymes were reassuring as was the rest of your labwork. Based on your history and exam, we suspect the pain is from your chest wall. As you are already on muscle relaxants, please take the pain medication for severe pain and rest. Please follow up with your PCP. If any symptoms change or worsen, please return to the nearest ED.

## 2019-10-09 ENCOUNTER — Encounter (HOSPITAL_BASED_OUTPATIENT_CLINIC_OR_DEPARTMENT_OTHER): Payer: Self-pay

## 2019-10-09 ENCOUNTER — Emergency Department (HOSPITAL_BASED_OUTPATIENT_CLINIC_OR_DEPARTMENT_OTHER)
Admission: EM | Admit: 2019-10-09 | Discharge: 2019-10-09 | Disposition: A | Discharge status: Home / Self Care | Payer: 59 | Attending: Emergency Medicine | Admitting: Emergency Medicine

## 2019-10-09 ENCOUNTER — Other Ambulatory Visit: Payer: Self-pay

## 2019-10-09 ENCOUNTER — Emergency Department (HOSPITAL_BASED_OUTPATIENT_CLINIC_OR_DEPARTMENT_OTHER): Payer: 59

## 2019-10-09 DIAGNOSIS — Z5321 Procedure and treatment not carried out due to patient leaving prior to being seen by health care provider: Secondary | ICD-10-CM | POA: Insufficient documentation

## 2019-10-09 DIAGNOSIS — R030 Elevated blood-pressure reading, without diagnosis of hypertension: Secondary | ICD-10-CM | POA: Diagnosis not present

## 2019-10-09 DIAGNOSIS — R2243 Localized swelling, mass and lump, lower limb, bilateral: Secondary | ICD-10-CM | POA: Insufficient documentation

## 2019-10-09 LAB — CBC
HCT: 41 % (ref 36.0–46.0)
Hemoglobin: 13 g/dL (ref 12.0–15.0)
MCH: 27.8 pg (ref 26.0–34.0)
MCHC: 31.7 g/dL (ref 30.0–36.0)
MCV: 87.6 fL (ref 80.0–100.0)
Platelets: 312 10*3/uL (ref 150–400)
RBC: 4.68 MIL/uL (ref 3.87–5.11)
RDW: 15.8 % — ABNORMAL HIGH (ref 11.5–15.5)
WBC: 10.1 10*3/uL (ref 4.0–10.5)
nRBC: 0 % (ref 0.0–0.2)

## 2019-10-09 LAB — BASIC METABOLIC PANEL
Anion gap: 11 (ref 5–15)
BUN: 9 mg/dL (ref 6–20)
CO2: 26 mmol/L (ref 22–32)
Calcium: 8.7 mg/dL — ABNORMAL LOW (ref 8.9–10.3)
Chloride: 100 mmol/L (ref 98–111)
Creatinine, Ser: 0.74 mg/dL (ref 0.44–1.00)
GFR calc Af Amer: >60 mL/min (ref >60)
GFR calc non Af Amer: >60 mL/min (ref >60)
Glucose, Bld: 93 mg/dL (ref 70–99)
Potassium: 3.8 mmol/L (ref 3.5–5.1)
Sodium: 137 mmol/L (ref 135–145)

## 2019-10-09 LAB — D-DIMER, QUANTITATIVE: D-Dimer, Quant: 0.31 ug/mL-FEU (ref 0.00–0.50)

## 2019-10-09 LAB — TROPONIN I (HIGH SENSITIVITY): Troponin I (High Sensitivity): <2 ng/L (ref <18)

## 2019-10-09 NOTE — ED Triage Notes (Signed)
Pt arrives ambulatory to ED with reports of swelling to ankles over the last 2 weeks pt also reports recent elevated BP of 165/102 at work today.

## 2020-03-05 ENCOUNTER — Telehealth: Payer: Self-pay | Admitting: Adult Health

## 2020-03-05 NOTE — Telephone Encounter (Signed)
Yes

## 2020-03-05 NOTE — Telephone Encounter (Signed)
Ellen Hood saw you at [facility] in early 2000 and wants to see you again. Would you be willing to see her?

## 2020-04-01 ENCOUNTER — Other Ambulatory Visit: Payer: Self-pay

## 2020-04-01 ENCOUNTER — Encounter: Payer: Self-pay | Admitting: Adult Health

## 2020-04-01 ENCOUNTER — Ambulatory Visit (INDEPENDENT_AMBULATORY_CARE_PROVIDER_SITE_OTHER): Payer: 59 | Admitting: Adult Health

## 2020-04-01 VITALS — BP 132/81 | HR 87

## 2020-04-01 DIAGNOSIS — F909 Attention-deficit hyperactivity disorder, unspecified type: Secondary | ICD-10-CM

## 2020-04-01 DIAGNOSIS — F411 Generalized anxiety disorder: Secondary | ICD-10-CM

## 2020-04-01 MED ORDER — HYDROXYZINE HCL 25 MG PO TABS: 25.0000 mg | ORAL_TABLET | Freq: Four times a day (QID) | ORAL | 5 refills | Status: DC | PRN

## 2020-04-01 MED ORDER — AMPHETAMINE-DEXTROAMPHETAMINE 20 MG PO TABS: 20.0000 mg | ORAL_TABLET | Freq: Two times a day (BID) | ORAL | 0 refills | Status: DC

## 2020-04-01 NOTE — Progress Notes (Signed)
Ellen Hood [id number] 03/06/1971 49 y.o.  Subjective:   Patient ID:  Ellen Hood is a 49 y.o. (DOB 03/06/1971) female.  Chief Complaint: No chief complaint on file.   HPI Ellen Hood presents to the office today for follow-up of ADHD and Anxiety.  Describes mood today as "ok". Pleasant. Mood symptoms - denies depression and irritability. Feels anxious at times. Stating "I'm doing alright". Stable interest and motivation. Taking medications as prescribed.  Energy levels lower. Active, does not have a regular exercise routine.  Enjoys some usual interests and activities. Single. Lives alone. Mother local. Spending time with family. Appetite adequate. Weight gain - 62 pounds since March of this year.  Sleeps well most nights. Averages 8 hours when not on call - on call 4 hours. Focus and concentration difficulties - has been out of Adderall. Completing tasks. Managing aspects of household. Works full-time - home health care. Denies SI or HI.  Denies AH or VH.  Previous medication trials:     Review of Systems:  Review of Systems  Musculoskeletal: Negative for gait problem.  Neurological: Negative for tremors.  Psychiatric/Behavioral:       Please refer to HPI    Medications: I have reviewed the patient's current medications.  Current Outpatient Medications  Medication Sig Dispense Refill   albuterol (PROVENTIL HFA;VENTOLIN HFA) 108 (90 BASE) MCG/ACT inhaler Inhale into the lungs.     amphetamine-dextroamphetamine (ADDERALL) 20 MG tablet Take 1 tablet (20 mg total) by mouth 2 (two) times daily. 60 tablet 0   [START ON 04/29/2020] amphetamine-dextroamphetamine (ADDERALL) 20 MG tablet Take 1 tablet (20 mg total) by mouth 2 (two) times daily. 60 tablet 0   [START ON 05/27/2020] amphetamine-dextroamphetamine (ADDERALL) 20 MG tablet Take 1 tablet (20 mg total) by mouth 2 (two) times daily. 60 tablet 0   diphenhydrAMINE (BENADRYL) 25 MG tablet Take 25 mg by mouth at  bedtime.       gabapentin (NEURONTIN) 100 MG capsule Take 1 capsule (100 mg total) by mouth 3 (three) times daily for 14 days. 42 capsule 0   hydrOXYzine (ATARAX/VISTARIL) 25 MG tablet Take 1 tablet (25 mg total) by mouth every 6 (six) hours as needed for anxiety. 30 tablet 5   naproxen (NAPROSYN) 500 MG tablet Take 1 tablet (500 mg total) by mouth 2 (two) times daily. 30 tablet 0   oxyCODONE-acetaminophen (PERCOCET/ROXICET) 5-325 MG tablet Take 1 tablet by mouth every 4 (four) hours as needed. 15 tablet 0   tizanidine (ZANAFLEX) 2 MG capsule Take 2 mg by mouth 3 (three) times daily.     Topiramate ER (TROKENDI XR) 100 MG CP24 Take 100 capsules by mouth daily.     traMADol (ULTRAM) 50 MG tablet Take 1 tablet (50 mg total) by mouth every 6 (six) hours as needed. 15 tablet 0   No current facility-administered medications for this visit.    Medication Side Effects: None  Allergies:  Allergies  Allergen Reactions   Topiramate     Other reaction(s): Confusion And weight gain    Past Medical History:  Diagnosis Date   Back pain    Endometriosis    Migraine    PE (pulmonary embolism)     No family history on file.  Social History   Socioeconomic History   Marital status: Single    Spouse name: Not on file   Number of children: Not on file   Years of education: Not on file   Highest education level: Not on file  Occupational History   Not on file  Tobacco Use   Smoking status: Never Smoker   Smokeless tobacco: Never Used  Vaping Use   Vaping Use: Never used  Substance and Sexual Activity   Alcohol use: No    Alcohol/week: 0.0 standard drinks   Drug use: No   Sexual activity: Yes    Birth control/protection: Surgical  Other Topics Concern   Not on file  Social History Narrative   Not on file   Social Determinants of Health   Financial Resource Strain:    Difficulty of Paying Living Expenses: Not on file  Food Insecurity:    Worried  About Programme researcher, broadcasting/film/video in the Last Year: Not on file   [employer] of Food in the Last Year: Not on file  Transportation Needs:    Lack of Transportation (Medical): Not on file   Lack of Transportation (Non-Medical): Not on file  Physical Activity:    Days of Exercise per Week: Not on file   Minutes of Exercise per Session: Not on file  Stress:    Feeling of Stress : Not on file  Social Connections:    Frequency of Communication with Friends and Family: Not on file   Frequency of Social Gatherings with Friends and Family: Not on file   Attends Religious Services: Not on file   Active Member of Clubs or Organizations: Not on file   Attends Banker Meetings: Not on file   Marital Status: Not on file  Intimate Partner Violence:    Fear of Current or Ex-Partner: Not on file   Emotionally Abused: Not on file   Physically Abused: Not on file   Sexually Abused: Not on file    Past Medical History, Surgical history, Social history, and Family history were reviewed and updated as appropriate.   Please see review of systems for further details on the patient's review from today.   Objective:   Physical Exam:  BP 132/81    Pulse 87   Physical Exam Constitutional:      General: She is not in acute distress. Musculoskeletal:        General: No deformity.  Neurological:     Mental Status: She is alert and oriented to person, place, and time.     Coordination: Coordination normal.  Psychiatric:        Attention and Perception: Attention and perception normal. She does not perceive auditory or visual hallucinations.        Mood and Affect: Mood normal. Mood is not anxious or depressed. Affect is not labile, blunt, angry or inappropriate.        Speech: Speech normal.        Behavior: Behavior normal.        Thought Content: Thought content normal. Thought content is not paranoid or delusional. Thought content does not include homicidal or suicidal ideation.  Thought content does not include homicidal or suicidal plan.        Cognition and Memory: Cognition and memory normal.        Judgment: Judgment normal.     Comments: Insight intact     Lab Review:     Component Value Date/Time   NA 137 10/09/2019 1329   NA 143 06/12/2015 1351   NA 137 01/05/2015 1312   K 3.8 10/09/2019 1329   K 4.4 06/12/2015 1351   K 3.6 01/05/2015 1312   CL 100 10/09/2019 1329   CL 107 (H) 06/12/2015 1351  CL 105 01/05/2015 1312   CO2 26 10/09/2019 1329   CO2 23 06/12/2015 1351   CO2 23 01/05/2015 1312   GLUCOSE 93 10/09/2019 1329   GLUCOSE 88 01/05/2015 1312   BUN 9 10/09/2019 1329   BUN 13 06/12/2015 1351   BUN 14 01/05/2015 1312   CREATININE 0.74 10/09/2019 1329   CREATININE 0.85 06/12/2015 1351   CREATININE 0.9 01/05/2015 1312   CALCIUM 8.7 (L) 10/09/2019 1329   CALCIUM 9.6 06/12/2015 1351   CALCIUM 9.4 01/05/2015 1312   PROT 8.4 (H) 02/22/2019 2013   PROT 8.2 06/12/2015 1351   PROT 7.7 01/05/2015 1312   ALBUMIN 3.8 02/22/2019 2013   ALBUMIN 3.9 06/12/2015 1351   AST 23 02/22/2019 2013   AST 15 06/12/2015 1351   AST 19 01/05/2015 1312   ALT 22 02/22/2019 2013   ALT 19 06/12/2015 1351   ALT 23 01/05/2015 1312   ALKPHOS 110 02/22/2019 2013   ALKPHOS 119 (H) 06/12/2015 1351   ALKPHOS 87 (H) 01/05/2015 1312   BILITOT 0.6 02/22/2019 2013   BILITOT 0.3 06/12/2015 1351   BILITOT 0.50 01/05/2015 1312   GFRNONAA >60 10/09/2019 1329   GFRAA >60 10/09/2019 1329       Component Value Date/Time   WBC 10.1 10/09/2019 1329   RBC 4.68 10/09/2019 1329   HGB 13.0 10/09/2019 1329   HGB 12.7 06/12/2015 1351   HCT 41.0 10/09/2019 1329   HCT 39.6 06/12/2015 1351   PLT 312 10/09/2019 1329   PLT 259 06/12/2015 1351   MCV 87.6 10/09/2019 1329   MCV 89 06/12/2015 1351   MCH 27.8 10/09/2019 1329   MCHC 31.7 10/09/2019 1329   RDW 15.8 (H) 10/09/2019 1329   RDW 14.8 06/12/2015 1351   LYMPHSABS 3.7 10/13/2018 1800   LYMPHSABS 2.8 06/12/2015 1351    MONOABS 0.8 10/13/2018 1800   EOSABS 0.2 10/13/2018 1800   EOSABS 0.1 06/12/2015 1351   BASOSABS 0.0 10/13/2018 1800   BASOSABS 0.0 06/12/2015 1351    No results found for: POCLITH, LITHIUM   No results found for: PHENYTOIN, PHENOBARB, VALPROATE, CBMZ   .res Assessment: Plan:    Plan:  PDMP reviewed  1. Add Adderall 20mg  BID 2. Add Hydroxyzine 25mg  up to 4 x daily.  Read and reviewed note with patient for accuracy.   RTC 4 weeks  Patient advised to contact office with any questions, adverse effects, or acute worsening in signs and symptoms.  Discussed potential benefits, risks, and side effects of stimulants with patient to include increased heart rate, palpitations, insomnia, increased anxiety, increased irritability, or decreased appetite.  Instructed patient to contact office if experiencing any significant tolerability issues.   Diagnoses and all orders for this visit:  Attention deficit hyperactivity disorder (ADHD), unspecified ADHD type -     amphetamine-dextroamphetamine (ADDERALL) 20 MG tablet; Take 1 tablet (20 mg total) by mouth 2 (two) times daily. -     amphetamine-dextroamphetamine (ADDERALL) 20 MG tablet; Take 1 tablet (20 mg total) by mouth 2 (two) times daily. -     amphetamine-dextroamphetamine (ADDERALL) 20 MG tablet; Take 1 tablet (20 mg total) by mouth 2 (two) times daily.  Generalized anxiety disorder -     hydrOXYzine (ATARAX/VISTARIL) 25 MG tablet; Take 1 tablet (25 mg total) by mouth every 6 (six) hours as needed for anxiety.     Please see After Visit Summary for patient specific instructions.  No future appointments.  No orders of the defined types were placed in  this encounter.   -------------------------------

## 2020-04-13 ENCOUNTER — Emergency Department (HOSPITAL_COMMUNITY)
Admission: EM | Admit: 2020-04-13 | Discharge: 2020-04-13 | Disposition: A | Discharge status: Home / Self Care | Payer: 59 | Attending: Emergency Medicine | Admitting: Emergency Medicine

## 2020-04-13 ENCOUNTER — Emergency Department (HOSPITAL_COMMUNITY): Payer: 59

## 2020-04-13 ENCOUNTER — Other Ambulatory Visit: Payer: Self-pay

## 2020-04-13 DIAGNOSIS — R4189 Other symptoms and signs involving cognitive functions and awareness: Secondary | ICD-10-CM | POA: Diagnosis not present

## 2020-04-13 DIAGNOSIS — G43809 Other migraine, not intractable, without status migrainosus: Secondary | ICD-10-CM | POA: Diagnosis not present

## 2020-04-13 DIAGNOSIS — R42 Dizziness and giddiness: Secondary | ICD-10-CM | POA: Diagnosis present

## 2020-04-13 DIAGNOSIS — R4689 Other symptoms and signs involving appearance and behavior: Secondary | ICD-10-CM

## 2020-04-13 LAB — CBC WITH DIFFERENTIAL/PLATELET
Abs Immature Granulocytes: 0.02 10*3/uL (ref 0.00–0.07)
Basophils Absolute: 0 10*3/uL (ref 0.0–0.1)
Basophils Relative: 1 %
Eosinophils Absolute: 0.3 10*3/uL (ref 0.0–0.5)
Eosinophils Relative: 5 %
HCT: 41.8 % (ref 36.0–46.0)
Hemoglobin: 12.9 g/dL (ref 12.0–15.0)
Immature Granulocytes: 0 %
Lymphocytes Relative: 37 %
Lymphs Abs: 2.5 10*3/uL (ref 0.7–4.0)
MCH: 27.1 pg (ref 26.0–34.0)
MCHC: 30.9 g/dL (ref 30.0–36.0)
MCV: 87.8 fL (ref 80.0–100.0)
Monocytes Absolute: 0.4 10*3/uL (ref 0.1–1.0)
Monocytes Relative: 7 %
Neutro Abs: 3.4 10*3/uL (ref 1.7–7.7)
Neutrophils Relative %: 50 %
Platelets: 341 10*3/uL (ref 150–400)
RBC: 4.76 MIL/uL (ref 3.87–5.11)
RDW: 15.1 % (ref 11.5–15.5)
WBC: 6.6 10*3/uL (ref 4.0–10.5)
nRBC: 0 % (ref 0.0–0.2)

## 2020-04-13 LAB — COMPREHENSIVE METABOLIC PANEL
ALT: 27 U/L (ref 0–44)
AST: 20 U/L (ref 15–41)
Albumin: 3.2 g/dL — ABNORMAL LOW (ref 3.5–5.0)
Alkaline Phosphatase: 99 U/L (ref 38–126)
Anion gap: 10 (ref 5–15)
BUN: 15 mg/dL (ref 6–20)
CO2: 23 mmol/L (ref 22–32)
Calcium: 9 mg/dL (ref 8.9–10.3)
Chloride: 106 mmol/L (ref 98–111)
Creatinine, Ser: 1.02 mg/dL — ABNORMAL HIGH (ref 0.44–1.00)
GFR, Estimated: >60 mL/min (ref >60)
Glucose, Bld: 116 mg/dL — ABNORMAL HIGH (ref 70–99)
Potassium: 3.9 mmol/L (ref 3.5–5.1)
Sodium: 139 mmol/L (ref 135–145)
Total Bilirubin: 0.7 mg/dL (ref 0.3–1.2)
Total Protein: 7.7 g/dL (ref 6.5–8.1)

## 2020-04-13 LAB — PROTIME-INR
INR: 1.1 (ref 0.8–1.2)
Prothrombin Time: 13.6 seconds (ref 11.4–15.2)

## 2020-04-13 LAB — I-STAT BETA HCG BLOOD, ED (MC, WL, AP ONLY): I-stat hCG, quantitative: <5 m[IU]/mL (ref <5)

## 2020-04-13 LAB — ETHANOL: Alcohol, Ethyl (B): <10 mg/dL (ref <10)

## 2020-04-13 MED ORDER — DIPHENHYDRAMINE HCL 50 MG/ML IJ SOLN
25.0000 mg | Freq: Once | INTRAMUSCULAR | Status: AC
  Administered 2020-04-13: 25 mg via INTRAVENOUS
  Filled 2020-04-13: qty 1

## 2020-04-13 MED ORDER — PROCHLORPERAZINE EDISYLATE 10 MG/2ML IJ SOLN
10.0000 mg | Freq: Once | INTRAMUSCULAR | Status: AC
  Administered 2020-04-13: 10 mg via INTRAVENOUS
  Filled 2020-04-13: qty 2

## 2020-04-13 MED ORDER — LACTATED RINGERS IV BOLUS
1000.0000 mL | Freq: Once | INTRAVENOUS | Status: AC
  Administered 2020-04-13: 1000 mL via INTRAVENOUS

## 2020-04-13 MED ORDER — KETOROLAC TROMETHAMINE 30 MG/ML IJ SOLN
30.0000 mg | Freq: Once | INTRAMUSCULAR | Status: AC
  Administered 2020-04-13: 30 mg via INTRAVENOUS
  Filled 2020-04-13: qty 1

## 2020-04-13 NOTE — ED Notes (Addendum)
Pt sleeping at this time.

## 2020-04-13 NOTE — ED Provider Notes (Signed)
[facility] EMERGENCY DEPARTMENT Provider Note   CSN: [id number] Arrival date & time: 04/13/20  1048     History Chief Complaint  Patient presents with  . Stroke Symptoms    Ellen Hood is a 49 y.o. female.  HPI Patient was last normal yesterday evening.  Patient went to work this morning.  She works in the patient care.  Upon arrival the patient was described as "not being herself".  At 8 AM she reportedly texted a coworker stating she did not feel well.  He describes feeling dizzy.  She seemed to become more agitated and confused over several hours in the morning.  She then began exhibiting unusual behaviors like eating paper and extreme fidgeting.  EMS was called.  EMS reports that the patient will not verbally interact with them.  She will follow some commands.  She is constantly been moving and rearranging straps and examining small things very closely.  Patient does not give me any verbal history.  She can follow some commands.  Patient's daughter had reported that similar behavior has been exhibited with migraines.  Review of EMR indicates patient has had chronic longstanding migraine history.  There is reported history of a "brain bleed" from coworkers.  EMR indicates patient had normal CT angiogram [phone] and MRI.  Both negative without any acute abnormalities.  No aneurysms or dissections present by angiogram.  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  She is managed by neurology and seen 11\2\2021.  They have tried multiple medication regimens but patient appears to continue getting chronic headaches.  Medication list includes:  Erenumabaooe injection every 30 days Phenergan as needed Zanaflex Ubrogepant propranolol    Past Medical History:  Diagnosis Date  . Back pain   . Endometriosis   . Migraine   . PE (pulmonary embolism)     Patient Active Problem List   Diagnosis Date Noted  .  Dyspnea 11/28/2014  . Pulmonary embolism (HCC) 11/27/2014    Past Surgical History:  Procedure Laterality Date  . ABDOMINAL HYSTERECTOMY    . BUNIONECTOMY Right   . CESAREAN SECTION    . HERNIA REPAIR    . LAPAROSCOPY       OB History   No obstetric history on file.     No family history on file.  Social History   Tobacco Use  . Smoking status: Never Smoker  . Smokeless tobacco: Never Used  Vaping Use  . Vaping Use: Never used  Substance Use Topics  . Alcohol use: No    Alcohol/week: 0.0 standard drinks  . Drug use: No    Home Medications Prior to Admission medications   Medication Sig Start Date End Date Taking? Authorizing Provider  albuterol (PROVENTIL HFA;VENTOLIN HFA) 108 (90 BASE) MCG/ACT inhaler Inhale into the lungs. 11/30/14 11/30/15  [provider]  amphetamine-dextroamphetamine (ADDERALL) 20 MG tablet Take 1 tablet (20 mg total) by mouth 2 (two) times daily. 04/01/20   Mozingo, Thereasa Solo, NP  amphetamine-dextroamphetamine (ADDERALL) 20 MG tablet Take 1 tablet (20 mg total) by mouth 2 (two) times daily. 04/29/20   Mozingo, Thereasa Solo, NP  amphetamine-dextroamphetamine (ADDERALL) 20 MG tablet Take 1 tablet (20 mg total) by mouth 2 (two) times daily. 05/27/20   Mozingo, Thereasa Solo, NP  diphenhydrAMINE (BENADRYL) 25 MG tablet Take 25 mg by mouth at bedtime.      [provider]  gabapentin (NEURONTIN) 100 MG capsule Take 1 capsule (  100 mg total) by mouth 3 (three) times daily for 14 days. 07/19/17 08/02/17  Maxwell Caul, PA-C  hydrOXYzine (ATARAX/VISTARIL) 25 MG tablet Take 1 tablet (25 mg total) by mouth every 6 (six) hours as needed for anxiety. 04/01/20   Mozingo, Thereasa Solo, NP  naproxen (NAPROSYN) 500 MG tablet Take 1 tablet (500 mg total) by mouth 2 (two) times daily. 06/24/17   Petrucelli, Pleas Koch, PA-C  oxyCODONE-acetaminophen (PERCOCET/ROXICET) 5-325 MG tablet Take 1 tablet by mouth every 4 (four) hours as needed.  02/22/19   Tegeler, Canary Brim, MD  tizanidine (ZANAFLEX) 2 MG capsule Take 2 mg by mouth 3 (three) times daily.    [provider]  Topiramate ER (TROKENDI XR) 100 MG CP24 Take 100 capsules by mouth daily.    [provider]  traMADol (ULTRAM) 50 MG tablet Take 1 tablet (50 mg total) by mouth every 6 (six) hours as needed. 06/24/17   Petrucelli, Samantha R, PA-C    Allergies    Topiramate  Review of Systems   Review of Systems Level 5 caveat cannot obtain review of systems.  Patient is currently not verbally interactive. Physical Exam Updated Vital Signs BP 113/69   Pulse 89   Temp 98.5 F (36.9 C) (Oral)   Resp (!) 21   SpO2 97%   Physical Exam Constitutional:      Comments: Patient is alert and nontoxic.  Clinically she is well in appearance.  She appears mildly anxious.  She is continuously straightening and rearranging straps on the stretcher.  She is picking up very small items and examining them closely.  She has not stopped moving some part of her body.  HENT:     Mouth/Throat:     Mouth: Mucous membranes are moist.     Pharynx: Oropharynx is clear.  Eyes:     Extraocular Movements: Extraocular movements intact.     Pupils: Pupils are equal, round, and reactive to light.  Cardiovascular:     Rate and Rhythm: Normal rate and regular rhythm.  Pulmonary:     Effort: Pulmonary effort is normal.     Breath sounds: Normal breath sounds.  Abdominal:     General: There is no distension.     Palpations: Abdomen is soft.     Tenderness: There is no abdominal tenderness. There is no guarding.  Musculoskeletal:     Comments: No peripheral edema.  Calves are soft and nontender.  Skin:    General: Skin is warm and dry.  Neurological:     Comments: Patient is very alert.  He has constant motions with rearranging blankets, straps or finding small bits of something to pick up and examined.  Her movements are very coordinated and purposeful.  She is using both  upper extremities to do detailed movements.  She will also follow some commands.  She will lean forward and take deep breaths.  She will follow commands to follow my fingers with her eyes.  She can lift both lower extremities off of the bed and hold.     ED Results / Procedures / Treatments   Labs (all labs ordered are listed, but only abnormal results are displayed) Labs Reviewed  COMPREHENSIVE METABOLIC PANEL  ETHANOL  CBC WITH DIFFERENTIAL/PLATELET  PROTIME-INR  URINALYSIS, ROUTINE W REFLEX MICROSCOPIC  RAPID URINE DRUG SCREEN, HOSP PERFORMED  I-STAT BETA HCG BLOOD, ED ([facility], [facility], [facility] ONLY)    EKG EKG Interpretation  Date/Time:  Monday April 13 2020 11:07:05 EST Ventricular Rate:  79 PR Interval:    QRS Duration: 57 QT Interval:  528 QTC Calculation: 606 R Axis:   52 Text Interpretation: Sinus rhythm Low voltage, precordial leads Borderline T abnormalities, anterior leads no sig change from previous Confirmed by Arby Barrette [phone]) on 04/13/2020 2:11:43 PM   Radiology No results found.  Procedures Procedures (including critical care time)  Medications Ordered in ED Medications  prochlorperazine (COMPAZINE) injection 10 mg (10 mg Intravenous Given 04/13/20 1122)  diphenhydrAMINE (BENADRYL) injection 25 mg (25 mg Intravenous Given 04/13/20 1123)  ketorolac (TORADOL) 30 MG/ML injection 30 mg (30 mg Intravenous Given 04/13/20 1123)  lactated ringers bolus 1,000 mL (1,000 mLs Intravenous New Bag/Given (Non-Interop) 04/13/20 1129)    ED Course  I have reviewed the triage vital signs and the nursing notes.  Pertinent labs & imaging results that were available during my care of the patient were reviewed by me and considered in my medical decision making (see chart for details).  Clinical Course as of Apr 15 931  Mon Apr 13, 2020  1229 Patient's nurse advised this patient is now exhibiting normal mental status.  Patient has reported this is happened previously she  feels much improved.   [MP]    Clinical Course User Index [MP] Arby Barrette, MD   MDM Rules/Calculators/A&P                          Patient presented alert and anxious in appearance with repetitive behaviors.  She was repeatedly straightening the tails of the straps on the stretcher.  She was using both extremities symmetrically.  She was focusing on very small bit of what looks like a pencil shaving in her lap, picking it up and breaking the pieces and reexamining it.  She was not speaking at all but following commands appropriately and having eye contact.  There was no focal motor deficit.  Patient has prior history of hemiplegic migraines and has had prior evaluation for stroke and aneurysm which were negative.  Patient was treated with migraine cocktail.  Symptoms completely resolved.  Patient is back to baseline with normal speech and cognitive function.  At this time, stable for discharge with close follow-up with neurology.  Patient is counseled not to drive.  Although there was no seizure activity, masticating or unusual eye movements, patient advised to follow-up with her neurologist before resuming typical activities. Final Clinical Impression(s) / ED Diagnoses Final diagnoses:  Other migraine without status migrainosus, not intractable  Cognitive and behavioral changes    Rx / DC Orders ED Discharge Orders    None       Arby Barrette, MD 04/14/20 [phone]

## 2020-04-13 NOTE — ED Notes (Signed)
Patient transported to CT 

## 2020-04-13 NOTE — ED Notes (Signed)
Pt is awake now, verbal, A/Ox4.

## 2020-04-13 NOTE — Discharge Instructions (Signed)
1.  You have atypical type migraine presentation.  You experienced an episode of significant confusion and change in behavior associated with your migraine.  All symptoms resolved with treatment for migraine headache. 2.  Call your neurologist today to schedule a recheck within the next 1 to 2 weeks. 3.  Continue your migraine regimen.  Do not drive a car until you has seen your neurologist for recheck.  You may need further evaluation for seizure although no seizure activity was seen.

## 2020-04-13 NOTE — ED Triage Notes (Signed)
Pt brought to ED via EMS from work. Coworkers called EMS stating patient was trying to eat paper and was fidgeting constantly, reported pt was "not herself" . Per EMS, pt texted coworker at approx 8AM reporting she wasn't feeling well, said she felt dizzy; boss spoke with pt around 9:45 and she said she didn't feel well. Symptoms progressed throughout morning. Hx of brain bleed per coworkers. Daughter reports pt has similar behaviors when she is experiencing migraines. On arrival to ED, pt alert but not speaking, constant arms movements, follows commands.   EMS v/s: 138/100 100% room air CBG 111 97.1 tympanic

## 2020-06-02 ENCOUNTER — Other Ambulatory Visit: Payer: Self-pay | Admitting: Adult Health

## 2020-06-02 ENCOUNTER — Telehealth: Payer: Self-pay | Admitting: Adult Health

## 2020-06-02 DIAGNOSIS — F909 Attention-deficit hyperactivity disorder, unspecified type: Secondary | ICD-10-CM

## 2020-06-02 MED ORDER — AMPHETAMINE-DEXTROAMPHETAMINE 20 MG PO TABS: 20.0000 mg | ORAL_TABLET | Freq: Two times a day (BID) | ORAL | 0 refills | Status: AC

## 2020-06-02 NOTE — Telephone Encounter (Signed)
Pt would like a refill on Adderall. Please send to [facility] on [address]. Pt said that pharmacy told her to call us because they do not have any more on file.

## 2020-06-02 NOTE — Telephone Encounter (Signed)
Script sent  

## 2020-07-02 ENCOUNTER — Other Ambulatory Visit: Payer: Self-pay | Admitting: Adult Health

## 2020-07-02 ENCOUNTER — Other Ambulatory Visit: Payer: Self-pay

## 2020-07-02 ENCOUNTER — Ambulatory Visit: Payer: 59 | Admitting: Adult Health

## 2020-07-02 DIAGNOSIS — F909 Attention-deficit hyperactivity disorder, unspecified type: Secondary | ICD-10-CM

## 2020-07-02 MED ORDER — AMPHETAMINE-DEXTROAMPHETAMINE 20 MG PO TABS: 20.0000 mg | ORAL_TABLET | Freq: Two times a day (BID) | ORAL | 0 refills | Status: DC

## 2020-07-22 ENCOUNTER — Ambulatory Visit: Payer: 59 | Admitting: Adult Health

## 2021-06-06 ENCOUNTER — Emergency Department (HOSPITAL_BASED_OUTPATIENT_CLINIC_OR_DEPARTMENT_OTHER): Payer: Self-pay

## 2021-06-06 ENCOUNTER — Encounter (HOSPITAL_BASED_OUTPATIENT_CLINIC_OR_DEPARTMENT_OTHER): Payer: Self-pay | Admitting: Emergency Medicine

## 2021-06-06 ENCOUNTER — Other Ambulatory Visit: Payer: Self-pay

## 2021-06-06 ENCOUNTER — Emergency Department (HOSPITAL_BASED_OUTPATIENT_CLINIC_OR_DEPARTMENT_OTHER)
Admission: EM | Admit: 2021-06-06 | Discharge: 2021-06-06 | Disposition: A | Discharge status: Home / Self Care | Payer: Self-pay | Attending: Emergency Medicine | Admitting: Emergency Medicine

## 2021-06-06 DIAGNOSIS — M79661 Pain in right lower leg: Secondary | ICD-10-CM | POA: Insufficient documentation

## 2021-06-06 DIAGNOSIS — M79604 Pain in right leg: Secondary | ICD-10-CM

## 2021-06-06 MED ORDER — OXYCODONE HCL 5 MG PO TABS: 5.0000 mg | ORAL_TABLET | ORAL | 0 refills | Status: DC | PRN

## 2021-06-06 MED ORDER — LIDOCAINE 5 % EX PTCH: 1.0000 | MEDICATED_PATCH | CUTANEOUS | 0 refills | Status: DC

## 2021-06-06 MED ORDER — HYDROCODONE-ACETAMINOPHEN 5-325 MG PO TABS
1.0000 | ORAL_TABLET | Freq: Once | ORAL | Status: AC
  Administered 2021-06-06: 1 via ORAL
  Filled 2021-06-06: qty 1

## 2021-06-06 NOTE — ED Provider Notes (Signed)
[facility] EMERGENCY DEPARTMENT Provider Note   CSN: [id number] Arrival date & time: 06/06/21  1023    History  Chief Complaint  Patient presents with   Leg Pain    Ellen Hood is Hood 51 y.o. female history of PE not anticoagulated here for evaluation of right lower extremity pain.  No known traumatic injuries.  Begins at right posterior knee, medial aspect thigh, proximal tib-fib.  Taking OTC meds without difficulty.  No overlying redness, warmth, paresthesias.  No midline back pain, bowel or bladder incontinence, saddle paresthesia.  Pain worse with movement.  No chest pain, shortness of breath.  No recent trips, immobilization  HPI     Home Medications Prior to Admission medications   Medication Sig Start Date End Date Taking? Authorizing Provider  lidocaine (LIDODERM) 5 % Place 1 patch onto the skin daily. Remove & Discard patch within 12 hours or as directed by MD 06/06/21  Yes Ellen Friel A, PA-C  oxyCODONE (ROXICODONE) 5 MG immediate release tablet Take 1 tablet (5 mg total) by mouth every 4 (four) hours as needed for severe pain. 06/06/21  Yes Ellen Roa A, PA-C  albuterol (PROVENTIL HFA;VENTOLIN HFA) 108 (90 BASE) MCG/ACT inhaler Inhale into the lungs. 11/30/14 11/30/15  [provider]  amphetamine-dextroamphetamine (ADDERALL) 20 MG tablet Take 1 tablet (20 mg total) by mouth 2 (two) times daily. 05/27/20   Mozingo, Berdie Ogren, NP  amphetamine-dextroamphetamine (ADDERALL) 20 MG tablet Take 1 tablet (20 mg total) by mouth 2 (two) times daily. 06/02/20   Mozingo, Berdie Ogren, NP  amphetamine-dextroamphetamine (ADDERALL) 20 MG tablet Take 1 tablet (20 mg total) by mouth 2 (two) times daily. 07/02/20   Mozingo, Berdie Ogren, NP  diphenhydrAMINE (BENADRYL) 25 MG tablet Take 25 mg by mouth at bedtime.      [provider]  gabapentin (NEURONTIN) 100 MG capsule Take 1 capsule (100 mg total) by mouth 3 (three) times daily for 14 days.  07/19/17 08/02/17  Ellen Napoleon, PA-C  hydrOXYzine (ATARAX/VISTARIL) 25 MG tablet Take 1 tablet (25 mg total) by mouth every 6 (six) hours as needed for anxiety. 04/01/20   Mozingo, Berdie Ogren, NP  naproxen (NAPROSYN) 500 MG tablet Take 1 tablet (500 mg total) by mouth 2 (two) times daily. 06/24/17   Petrucelli, Glynda Jaeger, PA-C  oxyCODONE-acetaminophen (PERCOCET/ROXICET) 5-325 MG tablet Take 1 tablet by mouth every 4 (four) hours as needed. 02/22/19   Tegeler, Gwenyth Allegra, MD  tizanidine (ZANAFLEX) 2 MG capsule Take 2 mg by mouth 3 (three) times daily.    [provider]  Topiramate ER (TROKENDI XR) 100 MG CP24 Take 100 capsules by mouth daily.    [provider]  traMADol (ULTRAM) 50 MG tablet Take 1 tablet (50 mg total) by mouth every 6 (six) hours as needed. 06/24/17   Petrucelli, Samantha R, PA-C      Allergies    Topiramate    Review of Systems   Review of Systems  Constitutional: Negative.   HENT: Negative.    Respiratory: Negative.    Cardiovascular: Negative.   Genitourinary: Negative.   Musculoskeletal:  Negative for back pain, gait problem, myalgias, neck pain and neck stiffness.       Right RLE pain  Skin: Negative.   Neurological: Negative.   All other systems reviewed and are negative.  Physical Exam Updated Vital Signs BP (!) 146/92 (BP Location: Right Arm)    Pulse 67    Temp 98.1 F (36.7 C) (Oral)  Resp 17    Ht 5\' 5"  (1.651 m)    Wt 86.2 kg    SpO2 99%    BMI 31.62 kg/m  Physical Exam Vitals and nursing note reviewed.  Constitutional:      General: She is not in acute distress.    Appearance: She is well-developed. She is not ill-appearing, toxic-appearing or diaphoretic.  HENT:     Head: Normocephalic and atraumatic.     Nose: Nose normal.     Mouth/Throat:     Mouth: Mucous membranes are moist.  Eyes:     Pupils: Pupils are equal, round, and reactive to light.  Cardiovascular:     Rate and Rhythm: Normal rate.     Pulses:  Normal pulses.          Dorsalis pedis pulses are 2+ on the right side and 2+ on the left side.       Posterior tibial pulses are 2+ on the right side and 2+ on the left side.     Heart sounds: Normal heart sounds.  Pulmonary:     Effort: Pulmonary effort is normal. No respiratory distress.     Breath sounds: Normal breath sounds.  Abdominal:     General: Bowel sounds are normal. There is no distension.     Palpations: Abdomen is soft.  Musculoskeletal:        General: Tenderness present. No swelling, deformity or signs of injury. Normal range of motion.     Cervical back: Normal range of motion.     Right lower leg: No edema.     Left lower leg: No edema.     Comments: Tenderness to posterior medial aspect right knee.  Compartments soft.  Able to flex and extend at bilateral hips, knee, ankles.  Compartments soft.  Ellen Hood' sign negative.  No midline C/T/L tenderness.  Negative straight leg raise Bl  Skin:    General: Skin is warm and dry.     Capillary Refill: Capillary refill takes less than 2 seconds.     Comments: No edema, erythema or warmth.  No fluctuance induration  Neurological:     General: No focal deficit present.     Mental Status: She is alert and oriented to person, place, and time.     Comments: Intact sensation, equal strength  Psychiatric:        Mood and Affect: Mood normal.    ED Results / Procedures / Treatments   Labs (all labs ordered are listed, but only abnormal results are displayed) Labs Reviewed - No data to display  EKG None  Radiology US Venous Img Lower Unilateral Right  Result Date: 06/06/2021 CLINICAL DATA:  Pain EXAM: Right LOWER EXTREMITY VENOUS DOPPLER ULTRASOUND TECHNIQUE: Gray-scale sonography with compression, as well as color and duplex ultrasound, were performed to evaluate the deep venous system(s) from the level of the common femoral vein through the popliteal and proximal calf veins. COMPARISON:  None. FINDINGS: VENOUS Normal  compressibility of the common femoral, superficial femoral, and popliteal veins, as well as the visualized calf veins. Technologist observed sluggish venous flow in the popliteal. There is compressibility of popliteal vein. Visualized portions of profunda femoral vein and great saphenous vein unremarkable. No filling defects to suggest DVT on grayscale or color Doppler imaging. Doppler waveforms show normal direction of venous flow, normal respiratory plasticity and response to augmentation. Limited views of the contralateral common femoral vein are unremarkable. OTHER None. Limitations: none IMPRESSION: There is no evidence acute DVT in  the right lower extremity. There is sluggish flow in the right popliteal vein without definite signs DVT. If there are continued symptoms, short-term follow-up study may be considered. Electronically Signed   By: Elmer Picker M.D.   On: 06/06/2021 14:25   DG Knee Complete 4 Views Right  Result Date: 06/06/2021 CLINICAL DATA:  Right lower extremity pain for 1 week EXAM: RIGHT KNEE - COMPLETE 4+ VIEW COMPARISON:  None. FINDINGS: Frontal, cross-table lateral, and bilateral oblique views of the right knee are obtained. No acute fracture, subluxation, or dislocation. Mild medial and lateral compartmental joint space narrowing and osteophyte formation consistent with osteoarthritis. No joint effusion. Soft tissues are unremarkable. IMPRESSION: 1. Mild medial and lateral compartmental osteoarthritis. No acute bony abnormality. Electronically Signed   By: Randa Ngo M.D.   On: 06/06/2021 15:46    Procedures Procedures    Medications Ordered in ED Medications  HYDROcodone-acetaminophen (NORCO/VICODIN) 5-325 MG per tablet 1 tablet (1 tablet Oral Given 06/06/21 1526)    ED Course/ Medical Decision Making/ Hood&P    51 year old here for evaluation of right lower extremity, knee pain began approximately 5 days ago.  Atraumatic.  She is afebrile, nonseptic, not  ill-appearing.  Pain to posterior aspect left knee and popliteal fossa as well as left distal medial femur medial aspect proximal calf.  She has no edema, erythema or warmth.  No fluctuance or induration.  She is neurovascularly intact.  Full range of motion without difficulty.  Of note she does have history of prior PE, is not currently anticoagulated.  She does deny any chest pain, shortness of breath currently.  Imaging personally reviewed and interpreted:  Ultrasound without evidence of DVT however radiology does suggest sluggish flow in right popliteal vein and recommend short-term follow-up with repeat ultrasound X-ray right knee shows osteoarthritis however no acute fracture, dislocation, effusion  Discussed results with patient.  Will treat symptomatically.  Discussed follow-up with PCP for repeat ultrasound, return for new or worsening symptoms.  Patient agreeable.  At this time I have low suspicion for acute septic joint, gout, hemarthrosis, occult fracture, dislocation, VTE, ischemia, rhabdomyolysis, myositis, compartment syndrome, radicular pain from back/hip.  The patient has been appropriately medically screened and/or stabilized in the ED. I have low suspicion for any other emergent medical condition which would require further screening, evaluation or treatment in the ED or require inpatient management.  Patient is hemodynamically stable and in no acute distress.  Patient able to ambulate in department prior to ED.  Evaluation does not show acute pathology that would require ongoing or additional emergent interventions while in the emergency department or further inpatient treatment.  I have discussed the diagnosis with the patient and answered all questions.  Pain is been managed while in the emergency department and patient has no further complaints prior to discharge.  Patient is comfortable with plan discussed in room and is stable for discharge at this time.  I have discussed strict  return precautions for returning to the emergency department.  Patient was encouraged to follow-up with PCP/specialist refer to at discharge.                           Medical Decision Making Amount and/or Complexity of Data Reviewed External Data Reviewed: radiology and notes.    Details: hx of sciatica, however sx today not consistent with same Radiology: ordered and independent interpretation performed. Decision-making details documented in ED Course.  Risk OTC drugs. Prescription drug  management.          Final Clinical Impression(s) / ED Diagnoses Final diagnoses:  Right leg pain    Rx / DC Orders ED Discharge Orders          Ordered    oxyCODONE (ROXICODONE) 5 MG immediate release tablet  Every 4 hours PRN        06/06/21 1608    lidocaine (LIDODERM) 5 %  Every 24 hours        06/06/21 1608              Davan Nawabi A, PA-C 06/06/21 1611    Malvin Johns, MD 06/07/21 [phone]

## 2021-06-06 NOTE — ED Triage Notes (Signed)
Pt arrives pov with c/o RLE pain x 1 week, worsening daily, not responding to otc meds. Denies injury, endorses hx of PE

## 2021-06-06 NOTE — Discharge Instructions (Addendum)
Your x-ray today showed evidence of osteoarthritis Ultrasound did not show any evidence of definitive blood clot however they are recommending follow-up within the week for repeat ultrasound given the sluggish flow in one of your veins.  I have written you for a few medications to help with your symptoms  Return for new worsening symptoms such as chest pain, shortness of breath, numbness, weakness, swelling to your extremities

## 2021-06-20 ENCOUNTER — Emergency Department (HOSPITAL_BASED_OUTPATIENT_CLINIC_OR_DEPARTMENT_OTHER): Payer: Self-pay

## 2021-06-20 ENCOUNTER — Other Ambulatory Visit: Payer: Self-pay

## 2021-06-20 ENCOUNTER — Encounter (HOSPITAL_BASED_OUTPATIENT_CLINIC_OR_DEPARTMENT_OTHER): Payer: Self-pay | Admitting: Emergency Medicine

## 2021-06-20 ENCOUNTER — Emergency Department (HOSPITAL_BASED_OUTPATIENT_CLINIC_OR_DEPARTMENT_OTHER)
Admission: EM | Admit: 2021-06-20 | Discharge: 2021-06-20 | Disposition: A | Discharge status: Home / Self Care | Payer: Self-pay | Attending: Emergency Medicine | Admitting: Emergency Medicine

## 2021-06-20 DIAGNOSIS — Z79899 Other long term (current) drug therapy: Secondary | ICD-10-CM | POA: Insufficient documentation

## 2021-06-20 DIAGNOSIS — M545 Low back pain, unspecified: Secondary | ICD-10-CM | POA: Insufficient documentation

## 2021-06-20 DIAGNOSIS — M25561 Pain in right knee: Secondary | ICD-10-CM | POA: Insufficient documentation

## 2021-06-20 DIAGNOSIS — M79604 Pain in right leg: Secondary | ICD-10-CM | POA: Insufficient documentation

## 2021-06-20 MED ORDER — DIAZEPAM 5 MG PO TABS
5.0000 mg | ORAL_TABLET | Freq: Once | ORAL | Status: AC
  Administered 2021-06-20: 5 mg via ORAL
  Filled 2021-06-20: qty 1

## 2021-06-20 MED ORDER — PREDNISONE 20 MG PO TABS: 40.0000 mg | ORAL_TABLET | Freq: Every day | ORAL | 0 refills | Status: AC

## 2021-06-20 MED ORDER — PREDNISONE 20 MG PO TABS
40.0000 mg | ORAL_TABLET | Freq: Once | ORAL | Status: AC
  Administered 2021-06-20: 40 mg via ORAL
  Filled 2021-06-20: qty 2

## 2021-06-20 NOTE — ED Provider Notes (Signed)
[facility] EMERGENCY DEPARTMENT Provider Note   CSN: [id number] Arrival date & time: 06/20/21  [id number]     History  Chief Complaint  Patient presents with   Leg Pain    Ellen Hood is a 51 y.o. female.  With past medical history of pulmonary embolism who presents emergency department with right leg pain.  Patient states that she has had the pain since early January.  She describes it as a constant, dull pain that is primarily in the posterior knee and posterior right thigh.  She states that she was seen previously for the same pain and prescribed oxycodone and lidocaine patches.  She states that she uses however they were not helpful so she stopped.  She states that at the time she was evaluated for DVT and was found to have sluggish blood flow and recommended follow-up.  She states that she had an appointment at the beginning of February however about 2 days ago she had an incident of stumbling at home where she had a sudden increase of sharp pain in the right leg.   She continues to deny any redness, swelling or warmth to the knee, ankle, hip.  She has had no unilateral leg swelling.  No recent long trips or immobilization.  No hormone use or smoking.  She does have a history of PE which required anticoagulation with Xarelto however she no longer takes this.  She denies any previous injuries to the leg, surgeries on the leg.  She denies acute weakness of the leg, saddle anesthesia, incontinence of bowel or bladder.  She does endorse some mild lumbar low back pain and right-sided low back pain associated with this.  Denies any known injuries to her back.  Denies IV drug use or fevers.   Leg Pain Associated symptoms: back pain   Associated symptoms: no fever       Home Medications Prior to Admission medications   Medication Sig Start Date End Date Taking? Authorizing Provider  albuterol (PROVENTIL HFA;VENTOLIN HFA) 108 (90 BASE) MCG/ACT inhaler Inhale into the lungs.  11/30/14 11/30/15  [provider]  amphetamine-dextroamphetamine (ADDERALL) 20 MG tablet Take 1 tablet (20 mg total) by mouth 2 (two) times daily. 05/27/20   Mozingo, Berdie Ogren, NP  amphetamine-dextroamphetamine (ADDERALL) 20 MG tablet Take 1 tablet (20 mg total) by mouth 2 (two) times daily. 06/02/20   Mozingo, Berdie Ogren, NP  amphetamine-dextroamphetamine (ADDERALL) 20 MG tablet Take 1 tablet (20 mg total) by mouth 2 (two) times daily. 07/02/20   Mozingo, Berdie Ogren, NP  diphenhydrAMINE (BENADRYL) 25 MG tablet Take 25 mg by mouth at bedtime.      [provider]  gabapentin (NEURONTIN) 100 MG capsule Take 1 capsule (100 mg total) by mouth 3 (three) times daily for 14 days. 07/19/17 08/02/17  Volanda Napoleon, PA-C  hydrOXYzine (ATARAX/VISTARIL) 25 MG tablet Take 1 tablet (25 mg total) by mouth every 6 (six) hours as needed for anxiety. 04/01/20   Mozingo, Berdie Ogren, NP  lidocaine (LIDODERM) 5 % Place 1 patch onto the skin daily. Remove & Discard patch within 12 hours or as directed by MD 06/06/21   Henderly, Britni A, PA-C  naproxen (NAPROSYN) 500 MG tablet Take 1 tablet (500 mg total) by mouth 2 (two) times daily. 06/24/17   Petrucelli, Samantha R, PA-C  oxyCODONE (ROXICODONE) 5 MG immediate release tablet Take 1 tablet (5 mg total) by mouth every 4 (four) hours as needed for severe pain. 06/06/21   Henderly, Britni  A, PA-C  oxyCODONE-acetaminophen (PERCOCET/ROXICET) 5-325 MG tablet Take 1 tablet by mouth every 4 (four) hours as needed. 02/22/19   Tegeler, Gwenyth Allegra, MD  tizanidine (ZANAFLEX) 2 MG capsule Take 2 mg by mouth 3 (three) times daily.    [provider]  Topiramate ER (TROKENDI XR) 100 MG CP24 Take 100 capsules by mouth daily.    [provider]  traMADol (ULTRAM) 50 MG tablet Take 1 tablet (50 mg total) by mouth every 6 (six) hours as needed. 06/24/17   Petrucelli, Samantha R, PA-C      Allergies    Topiramate    Review of Systems    Review of Systems  Constitutional:  Negative for fever.  Genitourinary:  Negative for difficulty urinating.  Musculoskeletal:  Positive for arthralgias, back pain, gait problem and myalgias.  Skin:  Negative for color change.  Neurological:  Positive for numbness.  All other systems reviewed and are negative.  Physical Exam Updated Vital Signs BP (!) 134/95 (BP Location: Right Arm)    Pulse 74    Temp 98.1 F (36.7 C)    Resp 18    Ht 5\' 5"  (1.651 m)    Wt 89.8 kg    SpO2 99%    BMI 32.95 kg/m  Physical Exam Vitals and nursing note reviewed.  Constitutional:      General: She is not in acute distress.    Appearance: Normal appearance. She is not ill-appearing or toxic-appearing.  HENT:     Head: Normocephalic and atraumatic.  Eyes:     General: No scleral icterus. Cardiovascular:     Rate and Rhythm: Normal rate and regular rhythm.     Pulses: Normal pulses.  Pulmonary:     Effort: Pulmonary effort is normal.     Breath sounds: Normal breath sounds.  Abdominal:     General: Bowel sounds are normal.     Palpations: Abdomen is soft.  Musculoskeletal:        General: Tenderness present. No swelling or deformity.     Cervical back: Normal range of motion and neck supple. No rigidity, tenderness or bony tenderness.     Thoracic back: No tenderness or bony tenderness.     Lumbar back: Tenderness and bony tenderness present. No swelling. Positive right straight leg raise test.       Back:     Right hip: Normal.     Left hip: Normal.     Right upper leg: Tenderness present. No swelling or edema.     Left upper leg: Normal.     Right knee: No swelling, deformity, effusion or erythema. Decreased range of motion. Tenderness present over the PCL. Normal pulse.     Right lower leg: No swelling. No edema.     Left lower leg: No swelling. No edema.       Legs:     Comments: There is to palpation of the posterior knee as well as the posterior right thigh.  There is tenderness to  palpation of the lumbar spine as well as the right lower back. Positive straight leg raise on the right Limited range of motion of the right knee due to pain There is no swelling, erythema or warmth of the joints Compartments soft  Skin:    General: Skin is warm and dry.     Capillary Refill: Capillary refill takes less than 2 seconds.     Findings: No bruising or erythema.  Neurological:     General: No focal  deficit present.     Mental Status: She is alert and oriented to person, place, and time. Mental status is at baseline.     Sensory: No sensory deficit.  Psychiatric:        Mood and Affect: Mood normal.        Behavior: Behavior normal.        Thought Content: Thought content normal.        Judgment: Judgment normal.    ED Results / Procedures / Treatments   Labs (all labs ordered are listed, but only abnormal results are displayed) Labs Reviewed - No data to display  EKG None  Radiology DG Lumbar Spine Complete  Result Date: 06/20/2021 CLINICAL DATA:  Low back pain radiating to right lower extremity EXAM: LUMBAR SPINE - COMPLETE 4+ VIEW COMPARISON:  Lumbar spine radiographs 04/30/2016 FINDINGS: There are 5 non-rib-bearing lumbar type vertebral bodies. Vertebral body heights are preserved. Alignment is normal. There is no spondylolysis. The disc spaces are overall preserved. There is minimal degenerative endplate change at L4 on L5. SI joints are intact. The soft tissues are unremarkable. IMPRESSION: Unremarkable lumbar spine radiographs.  No acute finding. Electronically Signed   By: Valetta Mole M.D.   On: 06/20/2021 11:20   US Venous Img Lower Right (DVT Study)  Result Date: 06/20/2021 CLINICAL DATA:  RIGHT lower extremity pain x1 month.  Follow-up. EXAM: RIGHT LOWER EXTREMITY VENOUS DOPPLER ULTRASOUND TECHNIQUE: Gray-scale sonography with compression, as well as color and duplex ultrasound, were performed to evaluate the deep venous system(s) from the level of the common  femoral vein through the popliteal and proximal calf veins. COMPARISON:  RIGHT lower extremity venous duplex, 06/06/21. FINDINGS: VENOUS Normal compressibility of the RIGHT common femoral, superficial femoral, and popliteal veins, as well as the visualized calf veins. Visualized portions of profunda femoral vein and great saphenous vein unremarkable. No filling defects to suggest DVT on grayscale or color Doppler imaging. Doppler waveforms show normal direction of venous flow, normal respiratory plasticity and response to augmentation. Limited views of the contralateral common femoral vein are unremarkable. OTHER No evidence of superficial thrombophlebitis or abnormal fluid collection. Limitations: none IMPRESSION: 1. No evidence of femoropopliteal DVT within the RIGHT lower extremity. 2. Sluggish flow and "rouleaux artifact" within the RIGHT popliteal vein. Findings may represent underlying venous insufficiency, however this can also be seen in normal patients and does not always convey clinical relevance. Michaelle Birks, MD [facility] Electronically Signed   By: Michaelle Birks M.D.   On: 06/20/2021 11:29    Procedures Procedures   Medications Ordered in ED Medications  diazepam (VALIUM) tablet 5 mg (5 mg Oral Given 06/20/21 1124)  predniSONE (DELTASONE) tablet 40 mg (40 mg Oral Given 06/20/21 1152)    ED Course/ Medical Decision Making/ A&P                           Medical Decision Making Amount and/or Complexity of Data Reviewed Radiology: ordered. ECG/medicine tests: ordered.  Risk Prescription drug management.  Patient presents to the ED with complaints of right leg pain. This involves an extensive number of treatment options, and is a complaint that carries with it a moderate risk of complications and morbidity.   Additional history obtained:  Additional history obtained from: External records from outside source obtained and  reviewed including: Previous ED visits  Imaging Studies ordered:  I ordered imaging studies which included ultrasound DVT study  of left leg, lumbar spine x-ray.  I independently reviewed & interpreted imaging & am in agreement with radiology impression. Imaging shows: Ultrasound venous DVT study of the right shows no evidence of femoral popliteal DVT within the right lower extremity.  There is sluggish flow "rouleaux artifact" within the right popliteal vein.  Findings may represent underlying venous insufficiency. Plain film lumbar spine shows unremarkable lumbar spine radiographs.  No acute findings  Medications  I ordered medication including Valium and prednisone for inflammation and pain Reevaluation of the patient after medication shows that patient  improving  Tests Considered: MRI lumbar spine  ED Course: 51 year old female who presents emergency department with right lower extremity pain.  Physical exam is remarkable for positive straight leg raise, pain to palpation of the posterior right thigh and posterior knee.  She does have mild tenderness to palpation of the lower back and right-sided low back.  Otherwise the physical exam is unremarkable.  Imaging as noted above.  Was given Valium and first dose of prednisone here in the emergency department.  She was seen previously for the same complaints but has had no relief of symptoms with opioids, lidocaine patches.  On repeat ultrasound imaging she continues to have sluggish flow in the right leg suggesting venous insufficiency however has been negative for DVT x2.  So will not anticoagulate her.  Additionally plain film of her back is unremarkable.  However she does have positive straight leg raise and pain that goes down the posterior thigh through the knee.  This may be due to possible sciatica or otherwise with lumbar radiculopathy.  She was given Valium in the emergency department for this. Her exam is negative for any swelling,  redness, erythema concerning for septic joint.  Compartments are soft.  Low suspicion for compartment syndrome, fractures or dislocations.  Regarding her low back pain there is no cauda equina symptoms. At this time will treat for possible sciatica/radiculopathy with steroids.  She has leftover opioids from previous ED visit which she can use for breakthrough pain.  I have instructed her to use ibuprofen 800 mg every 8 hours over the next few days for anti-inflammatory properties and she can use opioid pain relief if she is having breakthrough pain.  She will take first dose of prednisone here in the emergency department and then have 4 more days of treatment at home. She also has a follow-up appointment with hematology in the next week.  I have encouraged her to this appointment given her DVT studies and previous history of PE.  Dispostion: After consideration of the diagnostic results and the patients response to treatment, I feel that the patent would benefit from discharge. The patient has been appropriately medically screened and/or stabilized in the ED. I have low suspicion for any other emergent medical condition which would require further screening, evaluation or treatment in the ED or require inpatient management  Final Clinical Impression(s) / ED Diagnoses Final diagnoses:  Right leg pain    Rx / DC Orders ED Discharge Orders          Ordered    predniSONE (DELTASONE) 20 MG tablet  Daily        06/20/21 1144              Mickie Hillier, PA-C 06/20/21 [place], Kenedy, DO 06/20/21 1414

## 2021-06-20 NOTE — ED Notes (Signed)
ED Provider at bedside. 

## 2021-06-20 NOTE — Discharge Instructions (Signed)
You were seen in the emergency department today for right leg pain.  While you were here we tested you again for DVT as well as for any low back injuries.  Both of these images were negative.  It may be that you do have some nerve impingement and because of that we are giving you some steroids.  Additionally you should take ibuprofen 800 mg every 8 hours for pain relief over the next few days.  Please follow-up with Dr. Myna Hidalgo regarding your sluggish blood flow in the right leg.  I am also giving you the number for Clare Gandy, MD who is a sports medicine physician.  Please call their office Monday morning to set up follow-up appointment as well.  Please return to the emergency department if you are unable to walk on the leg.

## 2021-06-20 NOTE — ED Triage Notes (Signed)
Pt arrives pov, to triage in wheelchair with c/o continued RLE that has worsened over last 2 days after stumbling. Denies fall. Pt endorses no longer taking pain meds or lido patches

## 2021-06-20 NOTE — ED Notes (Signed)
Pt transported to Radiology 

## 2022-02-21 IMAGING — DX DG LUMBAR SPINE COMPLETE 4+V
5 series · 5 of 5 positions shown · non-contrast
Comparison: Lumbar spine radiographs 04/30/2016

CLINICAL DATA: Low back pain radiating to right lower extremity

EXAM:
LUMBAR SPINE - COMPLETE 4+ VIEW

[l-spine ap]
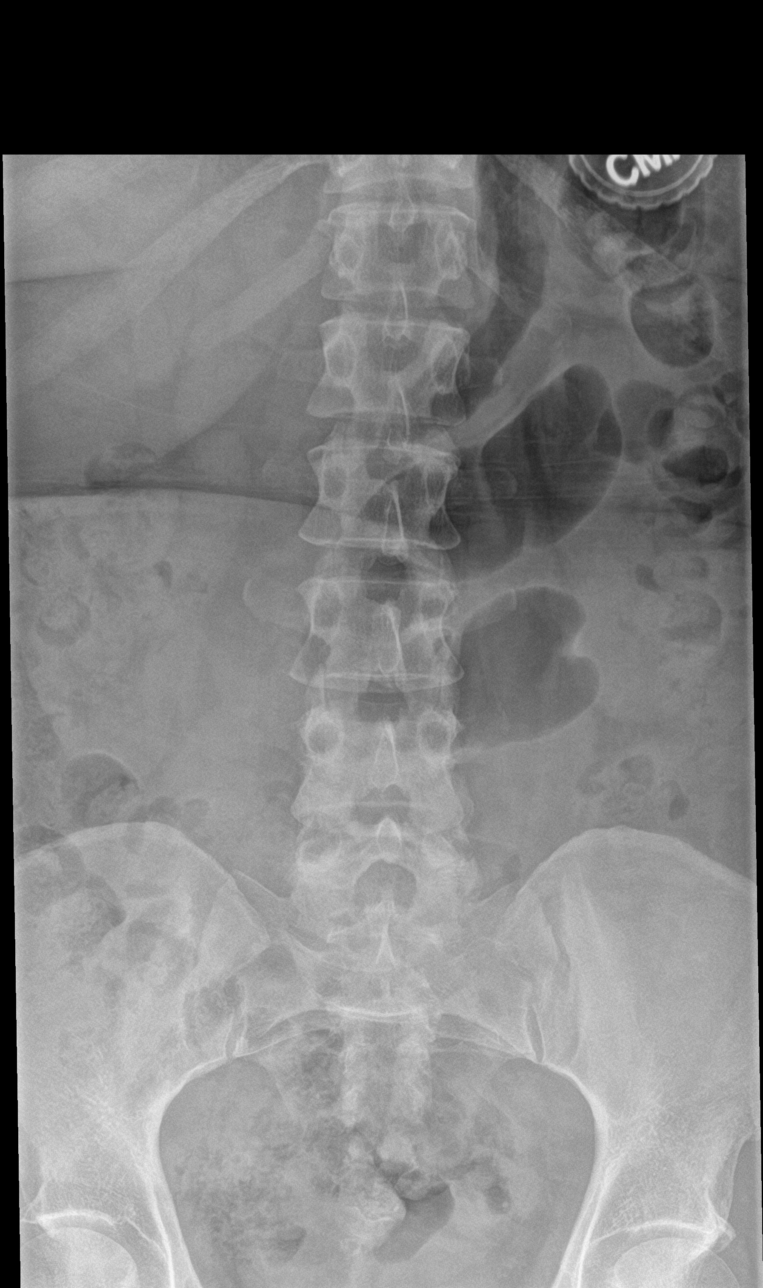

[l-spine obl (1 of 2)]
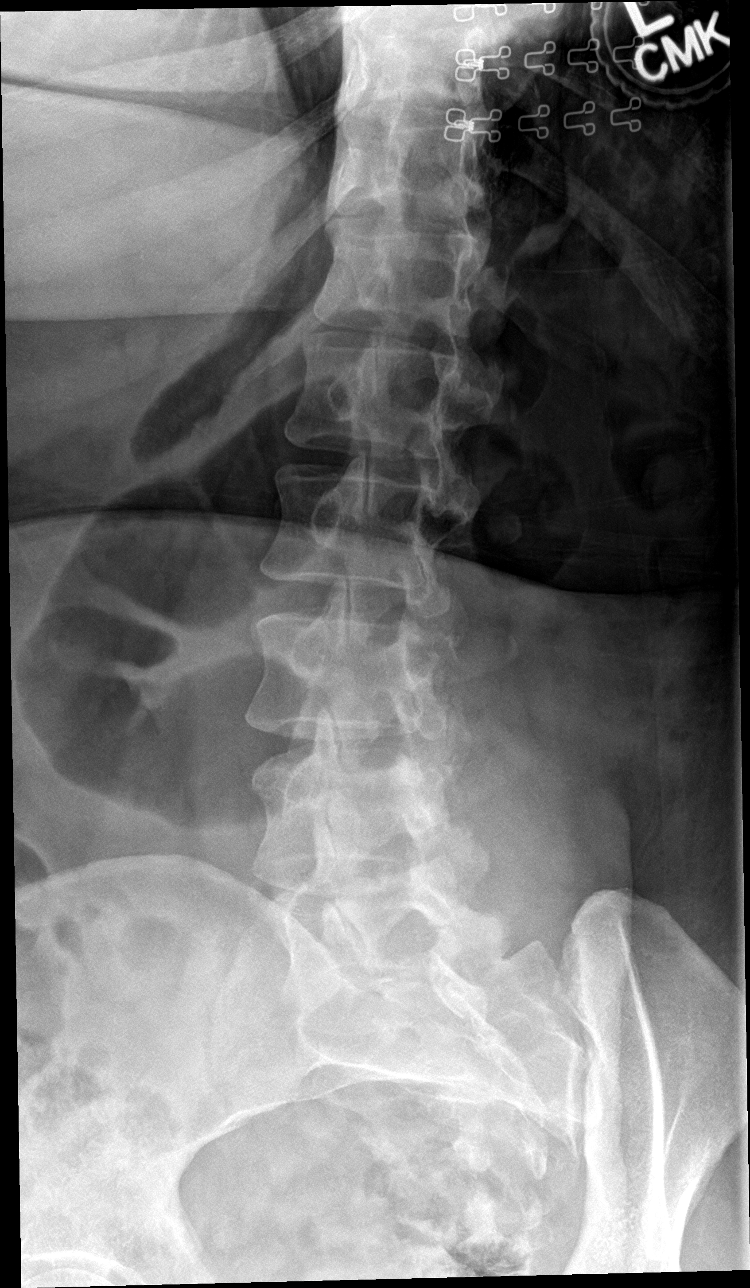

[l-spine obl (2 of 2)]
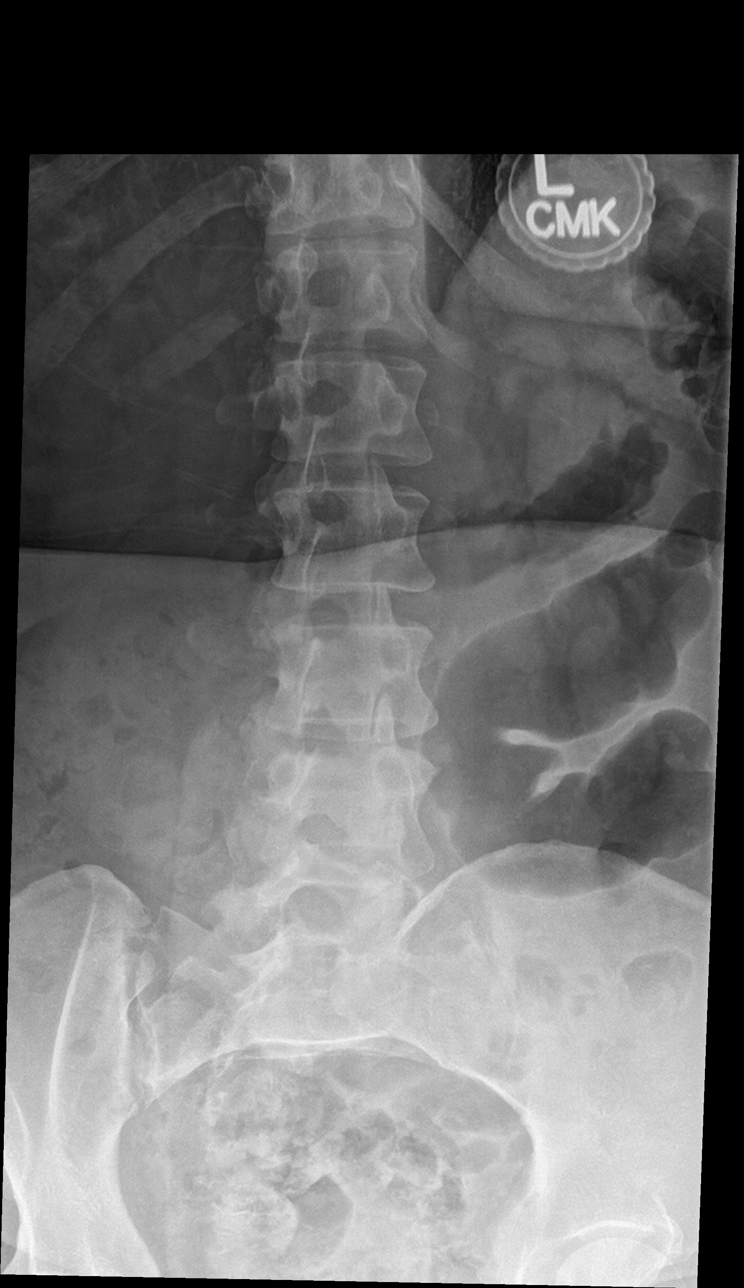

[l-spine lat]
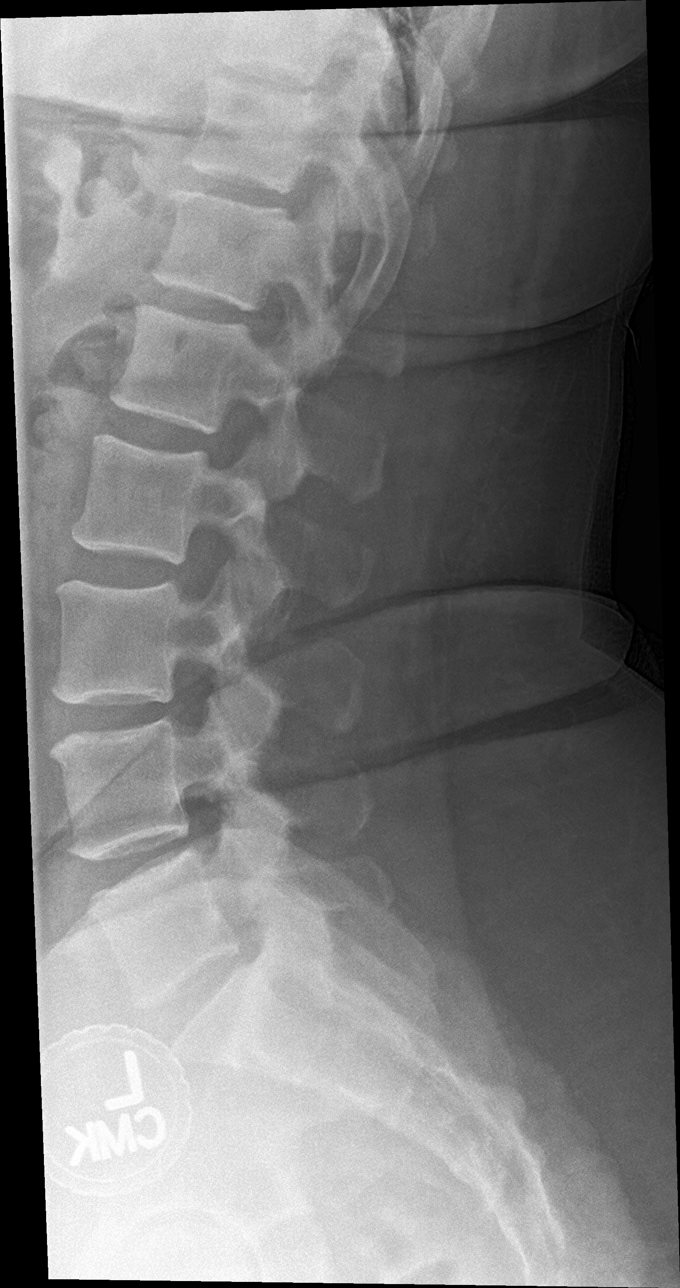

[l-spine spot]
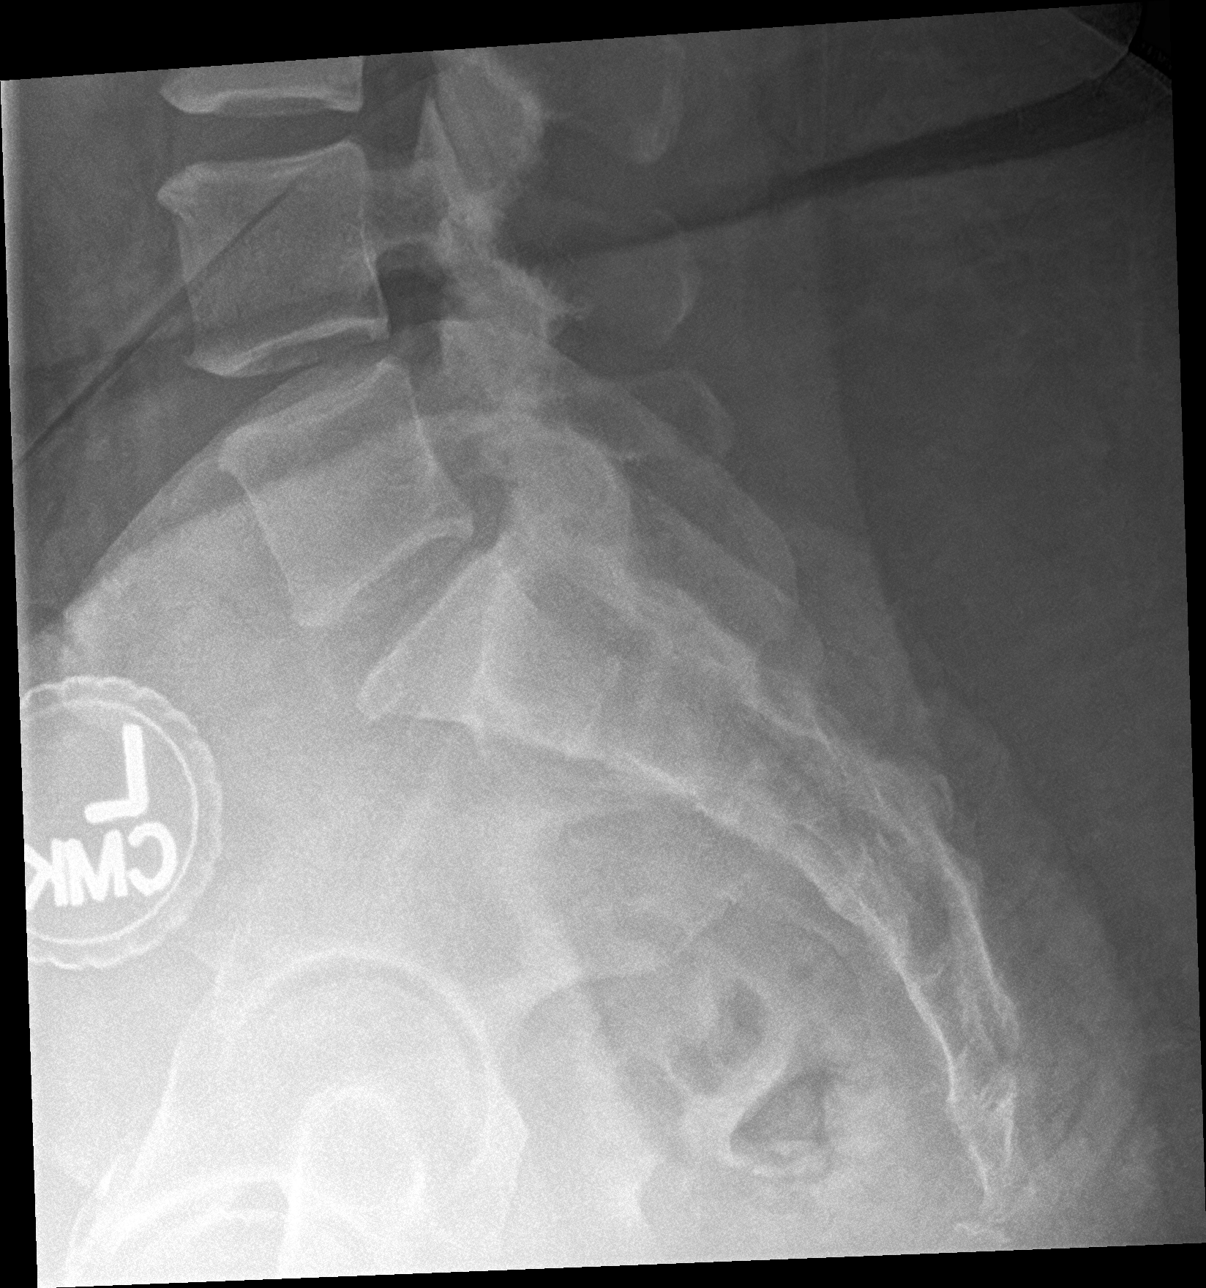

[5 of 5 positions shown; findings below may reference images not displayed]

FINDINGS: There are 5 non-rib-bearing lumbar type vertebral bodies. Vertebral
body heights are preserved. Alignment is normal. There is no
spondylolysis. The disc spaces are overall preserved. There is
minimal degenerative endplate change at L4 on L5. SI joints are
intact. The soft tissues are unremarkable.
IMPRESSION: Unremarkable lumbar spine radiographs.  No acute finding.

## 2022-02-25 ENCOUNTER — Other Ambulatory Visit: Payer: Self-pay

## 2022-02-25 ENCOUNTER — Emergency Department (HOSPITAL_BASED_OUTPATIENT_CLINIC_OR_DEPARTMENT_OTHER)
Admission: EM | Admit: 2022-02-25 | Discharge: 2022-02-26 | Disposition: A | Discharge status: Home / Self Care | Payer: BC Managed Care – PPO | Attending: Emergency Medicine | Admitting: Emergency Medicine

## 2022-02-25 ENCOUNTER — Encounter (HOSPITAL_BASED_OUTPATIENT_CLINIC_OR_DEPARTMENT_OTHER): Payer: Self-pay | Admitting: Urology

## 2022-02-25 DIAGNOSIS — G43909 Migraine, unspecified, not intractable, without status migrainosus: Secondary | ICD-10-CM | POA: Diagnosis not present

## 2022-02-25 DIAGNOSIS — G43809 Other migraine, not intractable, without status migrainosus: Secondary | ICD-10-CM

## 2022-02-25 DIAGNOSIS — R519 Headache, unspecified: Secondary | ICD-10-CM | POA: Diagnosis present

## 2022-02-25 LAB — CBC WITH DIFFERENTIAL/PLATELET
Abs Immature Granulocytes: 0.03 10*3/uL (ref 0.00–0.07)
Basophils Absolute: 0 10*3/uL (ref 0.0–0.1)
Basophils Relative: 0 %
Eosinophils Absolute: 0 10*3/uL (ref 0.0–0.5)
Eosinophils Relative: 0 %
HCT: 40 % (ref 36.0–46.0)
Hemoglobin: 12.9 g/dL (ref 12.0–15.0)
Immature Granulocytes: 0 %
Lymphocytes Relative: 30 %
Lymphs Abs: 3.1 10*3/uL (ref 0.7–4.0)
MCH: 28.3 pg (ref 26.0–34.0)
MCHC: 32.3 g/dL (ref 30.0–36.0)
MCV: 87.7 fL (ref 80.0–100.0)
Monocytes Absolute: 0.8 10*3/uL (ref 0.1–1.0)
Monocytes Relative: 8 %
Neutro Abs: 6.3 10*3/uL (ref 1.7–7.7)
Neutrophils Relative %: 62 %
Platelets: 338 10*3/uL (ref 150–400)
RBC: 4.56 MIL/uL (ref 3.87–5.11)
RDW: 16 % — ABNORMAL HIGH (ref 11.5–15.5)
WBC: 10.2 10*3/uL (ref 4.0–10.5)
nRBC: 0 % (ref 0.0–0.2)

## 2022-02-25 LAB — BASIC METABOLIC PANEL
Anion gap: 6 (ref 5–15)
BUN: 20 mg/dL (ref 6–20)
CO2: 27 mmol/L (ref 22–32)
Calcium: 8.5 mg/dL — ABNORMAL LOW (ref 8.9–10.3)
Chloride: 106 mmol/L (ref 98–111)
Creatinine, Ser: 0.74 mg/dL (ref 0.44–1.00)
GFR, Estimated: >60 mL/min (ref >60)
Glucose, Bld: 113 mg/dL — ABNORMAL HIGH (ref 70–99)
Potassium: 4.1 mmol/L (ref 3.5–5.1)
Sodium: 139 mmol/L (ref 135–145)

## 2022-02-25 MED ORDER — DEXAMETHASONE SODIUM PHOSPHATE 4 MG/ML IJ SOLN
4.0000 mg | Freq: Once | INTRAMUSCULAR | Status: AC
  Administered 2022-02-25: 4 mg via INTRAVENOUS
  Filled 2022-02-25: qty 1

## 2022-02-25 MED ORDER — SODIUM CHLORIDE 0.9 % IV BOLUS
1000.0000 mL | Freq: Once | INTRAVENOUS | Status: AC
  Administered 2022-02-25: 1000 mL via INTRAVENOUS

## 2022-02-25 MED ORDER — METOCLOPRAMIDE HCL 5 MG/ML IJ SOLN
10.0000 mg | Freq: Once | INTRAMUSCULAR | Status: AC
  Administered 2022-02-25: 10 mg via INTRAVENOUS
  Filled 2022-02-25: qty 2

## 2022-02-25 MED ORDER — METOCLOPRAMIDE HCL 10 MG PO TABS: 10.0000 mg | ORAL_TABLET | Freq: Four times a day (QID) | ORAL | 0 refills | Status: DC | PRN

## 2022-02-25 MED ORDER — DIPHENHYDRAMINE HCL 50 MG/ML IJ SOLN
25.0000 mg | Freq: Once | INTRAMUSCULAR | Status: AC
  Administered 2022-02-25: 25 mg via INTRAVENOUS
  Filled 2022-02-25: qty 1

## 2022-02-25 MED ORDER — KETOROLAC TROMETHAMINE 15 MG/ML IJ SOLN
15.0000 mg | Freq: Once | INTRAMUSCULAR | Status: AC
  Administered 2022-02-25: 15 mg via INTRAVENOUS
  Filled 2022-02-25: qty 1

## 2022-02-25 NOTE — ED Triage Notes (Signed)
Pt states bad migraine that started today at 0200 Reports nausea but no vomiting Sensitivity to light and sound  H/o migraines  Excedrin migraine last at 1430

## 2022-02-25 NOTE — Discharge Instructions (Addendum)
You likely have chronic migraines.  Please stay hydrated and continue Tylenol or Excedrin for headaches  Take Reglan for worsening headaches.  Follow-up with your headache specialist at [facility].  You likely need to be on maintenance medicine  Return to ER if you have worse headaches, vomiting.

## 2022-02-25 NOTE — ED Provider Notes (Signed)
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  Patient has history of headaches.  Patient states that she has migraines that is been diagnosed for several years.  She states that she had seen neurologist in the past.  She states that she has not been taking her medicines and tried just Excedrin migraines and also homeopathic medicines.  She states that she has bad migraines 3-4 times a week for the last several weeks.  She states that she took Excedrin today and that headache is just not letting up.  She denies any vomiting but felt nauseated.  She has some photophobia as well.  Patient denies any neck pain or stiffness or fever.  The history is provided by the patient.       Home Medications Prior to Admission medications   Medication Sig Start Date End Date Taking? Authorizing Provider  albuterol (PROVENTIL HFA;VENTOLIN HFA) 108 (90 BASE) MCG/ACT inhaler Inhale into the lungs. 11/30/14 11/30/15  [provider]  amphetamine-dextroamphetamine (ADDERALL) 20 MG tablet Take 1 tablet (20 mg total) by mouth 2 (two) times daily. 05/27/20   Mozingo, Thereasa Solo, NP  amphetamine-dextroamphetamine (ADDERALL) 20 MG tablet Take 1 tablet (20 mg total) by mouth 2 (two) times daily. 06/02/20   Mozingo, Thereasa Solo, NP  amphetamine-dextroamphetamine (ADDERALL) 20 MG tablet Take 1 tablet (20 mg total) by mouth 2 (two) times daily. 07/02/20   Mozingo, Thereasa Solo, NP  diphenhydrAMINE (BENADRYL) 25 MG tablet Take 25 mg by mouth at bedtime.      [provider]  gabapentin (NEURONTIN) 100 MG capsule Take 1 capsule (100 mg total) by mouth 3 (three) times daily for 14 days. 07/19/17 08/02/17  Maxwell Caul, PA-C  hydrOXYzine (ATARAX/VISTARIL) 25 MG tablet Take 1  tablet (25 mg total) by mouth every 6 (six) hours as needed for anxiety. 04/01/20   Mozingo, Thereasa Solo, NP  lidocaine (LIDODERM) 5 % Place 1 patch onto the skin daily. Remove & Discard patch within 12 hours or as directed by MD 06/06/21   Henderly, Britni A, PA-C  naproxen (NAPROSYN) 500 MG tablet Take 1 tablet (500 mg total) by mouth 2 (two) times daily. 06/24/17   Petrucelli, Samantha R, PA-C  oxyCODONE (ROXICODONE) 5 MG immediate release tablet Take 1 tablet (5 mg total) by mouth every 4 (four) hours as needed for severe pain. 06/06/21   Henderly, Britni A, PA-C  oxyCODONE-acetaminophen (PERCOCET/ROXICET) 5-325 MG tablet Take 1 tablet by mouth every 4 (four) hours as needed. 02/22/19   Tegeler, Canary Brim, MD  tizanidine (ZANAFLEX) 2 MG capsule Take 2 mg by mouth 3 (three) times daily.    [provider]  Topiramate ER (TROKENDI XR) 100 MG CP24 Take 100 capsules by mouth daily.    [provider]  traMADol (ULTRAM) 50 MG tablet Take 1 tablet (50 mg total) by mouth every 6 (six) hours as needed. 06/24/17   Petrucelli, Pleas Koch, PA-C      Allergies    Topiramate    Review of Systems   Review of Systems  Neurological:  Positive for headaches.  All other systems reviewed and are negative.   Physical Exam Updated Vital Signs BP (!) 154/95   Pulse 78   Temp 98.1 F (36.7 C) (  Oral)   Resp 18   Ht 5\' 5"  (1.651 m)   Wt 89.9 kg   SpO2 99%   BMI 32.98 kg/m  Physical Exam Vitals and nursing note reviewed.  Constitutional:      Comments: Photo phobia, uncomfortable  HENT:     Head: Normocephalic.     Nose: Nose normal.     Mouth/Throat:     Mouth: Mucous membranes are moist.  Eyes:     Extraocular Movements: Extraocular movements intact.     Pupils: Pupils are equal, round, and reactive to light.  Cardiovascular:     Rate and Rhythm: Normal rate and regular rhythm.     Pulses: Normal pulses.     Heart sounds: Normal heart sounds.  Pulmonary:     Effort:  Pulmonary effort is normal.     Breath sounds: Normal breath sounds.  Abdominal:     General: Abdomen is flat.     Palpations: Abdomen is soft.  Musculoskeletal:        General: Normal range of motion.     Cervical back: Normal range of motion and neck supple.  Skin:    General: Skin is warm.     Capillary Refill: Capillary refill takes less than 2 seconds.  Neurological:     General: No focal deficit present.     Mental Status: She is oriented to person, place, and time.     Comments: Cranial nerves II to XII intact.  No facial droop.  Patient has normal finger-to-nose bilaterally.  Normal strength bilaterally  Psychiatric:        Mood and Affect: Mood normal.        Behavior: Behavior normal.     ED Results / Procedures / Treatments   Labs (all labs ordered are listed, but only abnormal results are displayed) Labs Reviewed  CBC WITH DIFFERENTIAL/PLATELET  BASIC METABOLIC PANEL    EKG None  Radiology No results found.  Procedures Procedures    Medications Ordered in ED Medications  ketorolac (TORADOL) 15 MG/ML injection 15 mg (has no administration in time range)  sodium chloride 0.9 % bolus 1,000 mL (1,000 mLs Intravenous New Bag/Given 02/25/22 2300)  metoCLOPramide (REGLAN) injection 10 mg (10 mg Intravenous Given 02/25/22 2301)  diphenhydrAMINE (BENADRYL) injection 25 mg (25 mg Intravenous Given 02/25/22 2301)  dexamethasone (DECADRON) injection 4 mg (4 mg Intravenous Given 02/25/22 2301)    ED Course/ Medical Decision Making/ A&P                           Medical Decision Making Ellen Hood is a 51 y.o. female here presenting with headache.  Patient has history of migraines.  Patient has seen neurology in the past and has been having migraine attacks at least 3-4 times a week.  Patient has nonfocal neuro exam.  We will hold off on neuroimaging currently. We will check labs and give migraine cocktail  11:26 PM Labs unremarkable.  Headache improved.  She  follows up with [facility] neurology. I told her to call their office to follow-up again.  We will prescribe Reglan as needed.  Stable for discharge  Problems Addressed: Other migraine without status migrainosus, not intractable: chronic illness or injury with exacerbation, progression, or side effects of treatment  Amount and/or Complexity of Data Reviewed Labs: ordered. Decision-making details documented in ED Course.  Risk Prescription drug management.    Final Clinical Impression(s) / ED Diagnoses Final diagnoses:  None  Rx / DC Orders ED Discharge Orders     None         Charlynne Pander, MD 02/25/22 2328

## 2022-02-27 ENCOUNTER — Other Ambulatory Visit: Payer: Self-pay

## 2022-02-27 ENCOUNTER — Emergency Department (HOSPITAL_BASED_OUTPATIENT_CLINIC_OR_DEPARTMENT_OTHER): Payer: BC Managed Care – PPO

## 2022-02-27 ENCOUNTER — Encounter (HOSPITAL_BASED_OUTPATIENT_CLINIC_OR_DEPARTMENT_OTHER): Payer: Self-pay | Admitting: Emergency Medicine

## 2022-02-27 ENCOUNTER — Emergency Department (HOSPITAL_BASED_OUTPATIENT_CLINIC_OR_DEPARTMENT_OTHER)
Admission: EM | Admit: 2022-02-27 | Discharge: 2022-02-27 | Disposition: A | Discharge status: Home / Self Care | Payer: BC Managed Care – PPO | Attending: Emergency Medicine | Admitting: Emergency Medicine

## 2022-02-27 DIAGNOSIS — R519 Headache, unspecified: Secondary | ICD-10-CM | POA: Diagnosis present

## 2022-02-27 DIAGNOSIS — G43909 Migraine, unspecified, not intractable, without status migrainosus: Secondary | ICD-10-CM | POA: Diagnosis not present

## 2022-02-27 MED ORDER — DIPHENHYDRAMINE HCL 50 MG/ML IJ SOLN
25.0000 mg | Freq: Once | INTRAMUSCULAR | Status: DC
  Filled 2022-02-27: qty 1

## 2022-02-27 MED ORDER — KETOROLAC TROMETHAMINE 60 MG/2ML IM SOLN
30.0000 mg | Freq: Once | INTRAMUSCULAR | Status: AC
  Administered 2022-02-27: 30 mg via INTRAMUSCULAR
  Filled 2022-02-27: qty 2

## 2022-02-27 MED ORDER — PROCHLORPERAZINE EDISYLATE 10 MG/2ML IJ SOLN
10.0000 mg | Freq: Once | INTRAMUSCULAR | Status: DC
  Filled 2022-02-27: qty 2

## 2022-02-27 MED ORDER — PROCHLORPERAZINE EDISYLATE 10 MG/2ML IJ SOLN
10.0000 mg | Freq: Once | INTRAMUSCULAR | Status: AC
  Administered 2022-02-27: 10 mg via INTRAMUSCULAR
  Filled 2022-02-27: qty 2

## 2022-02-27 MED ORDER — DIPHENHYDRAMINE HCL 25 MG PO CAPS
25.0000 mg | ORAL_CAPSULE | Freq: Once | ORAL | Status: AC
  Administered 2022-02-27: 25 mg via ORAL
  Filled 2022-02-27: qty 1

## 2022-02-27 NOTE — ED Notes (Signed)
Headache since Friday . Denies any numbness or tingling in extremties . Unsteady gate

## 2022-02-27 NOTE — ED Provider Notes (Signed)
[facility] EMERGENCY DEPARTMENT Provider Note   CSN: [id number] Arrival date & time: 02/27/22  3825     History  Chief Complaint  Patient presents with   Headache    Ellen Hood is a 51 y.o. female.  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  Patient states that recently symptoms have been controlled without medication and she has been doing acupuncture.  More recently though, her headache frequency has increased.  She was seen in the emergency department on 02/25/2022 and treated with a migraine cocktail.  This did help improve her symptoms and yesterday she was doing fine until last evening when her symptoms returned.  Since Friday she has felt disequilibrium.  No vertigo or spinning sensation, but she states that when she is walking she feels like she is "walking sideways" or walking downhill when she is on flat ground.  She denies vision loss or vision change.  No weakness in the extremities.  She is able to ambulate without assistance.  She took Excedrin Migraine yesterday which was not helpful.  She has had nausea and light sensitivity, but no vomiting.  No head injuries or falls.  She is followed by neurology through [facility].  She denies URI symptoms, ear pain, runny nose, sore throat.       Home Medications Prior to Admission medications   Medication Sig Start Date End Date Taking? Authorizing Provider  albuterol (PROVENTIL HFA;VENTOLIN HFA) 108 (90 BASE) MCG/ACT inhaler Inhale into the lungs. 11/30/14 11/30/15  [provider]  amphetamine-dextroamphetamine (ADDERALL) 20 MG tablet Take 1 tablet (20 mg total) by mouth 2 (two) times daily. 05/27/20   Mozingo, Berdie Ogren, NP  amphetamine-dextroamphetamine (ADDERALL) 20 MG tablet Take 1 tablet (20 mg total) by mouth 2 (two) times daily. 06/02/20   Mozingo, Berdie Ogren, NP  amphetamine-dextroamphetamine (ADDERALL) 20 MG  tablet Take 1 tablet (20 mg total) by mouth 2 (two) times daily. 07/02/20   Mozingo, Berdie Ogren, NP  diphenhydrAMINE (BENADRYL) 25 MG tablet Take 25 mg by mouth at bedtime.      [provider]  gabapentin (NEURONTIN) 100 MG capsule Take 1 capsule (100 mg total) by mouth 3 (three) times daily for 14 days. 07/19/17 08/02/17  Volanda Napoleon, PA-C  hydrOXYzine (ATARAX/VISTARIL) 25 MG tablet Take 1 tablet (25 mg total) by mouth every 6 (six) hours as needed for anxiety. 04/01/20   Mozingo, Berdie Ogren, NP  lidocaine (LIDODERM) 5 % Place 1 patch onto the skin daily. Remove & Discard patch within 12 hours or as directed by MD 06/06/21   Henderly, Britni A, PA-C  metoCLOPramide (REGLAN) 10 MG tablet Take 1 tablet (10 mg total) by mouth every 6 (six) hours as needed for nausea (nausea/headache). 02/25/22   Drenda Freeze, MD  naproxen (NAPROSYN) 500 MG tablet Take 1 tablet (500 mg total) by mouth 2 (two) times daily. 06/24/17   Petrucelli, Samantha R, PA-C  oxyCODONE (ROXICODONE) 5 MG immediate release tablet Take 1 tablet (5 mg total) by mouth every 4 (four) hours as needed for severe pain. 06/06/21   Henderly, Britni A, PA-C  oxyCODONE-acetaminophen (PERCOCET/ROXICET) 5-325 MG tablet Take 1 tablet by mouth every 4 (four) hours as needed. 02/22/19   Tegeler, Gwenyth Allegra, MD  tizanidine (ZANAFLEX) 2 MG capsule Take 2 mg by mouth 3 (three) times daily.    [provider]  Topiramate ER (TROKENDI XR) 100 MG CP24 Take 100  capsules by mouth daily.    [provider]  traMADol (ULTRAM) 50 MG tablet Take 1 tablet (50 mg total) by mouth every 6 (six) hours as needed. 06/24/17   Petrucelli, Glynda Jaeger, PA-C      Allergies    Topiramate    Review of Systems   Review of Systems  Physical Exam Updated Vital Signs BP (!) 140/91 (BP Location: Right Arm)   Pulse 65   Temp 98.6 F (37 C) (Oral)   Resp 18   SpO2 95%  Physical Exam Vitals and nursing note reviewed.   Constitutional:      Appearance: She is well-developed.  HENT:     Head: Normocephalic and atraumatic.     Right Ear: Tympanic membrane, ear canal and external ear normal.     Left Ear: Tympanic membrane, ear canal and external ear normal.     Nose: Nose normal.     Mouth/Throat:     Pharynx: Uvula midline.  Eyes:     General: Lids are normal.     Extraocular Movements:     Right eye: No nystagmus.     Left eye: No nystagmus.     Conjunctiva/sclera: Conjunctivae normal.     Pupils: Pupils are equal, round, and reactive to light.  Cardiovascular:     Rate and Rhythm: Normal rate and regular rhythm.  Pulmonary:     Effort: Pulmonary effort is normal.     Breath sounds: Normal breath sounds.  Abdominal:     Palpations: Abdomen is soft.     Tenderness: There is no abdominal tenderness.  Musculoskeletal:     Cervical back: Normal range of motion and neck supple. No tenderness or bony tenderness.  Skin:    General: Skin is warm and dry.  Neurological:     Mental Status: She is alert and oriented to person, place, and time.     GCS: GCS eye subscore is 4. GCS verbal subscore is 5. GCS motor subscore is 6.     Cranial Nerves: No cranial nerve deficit.     Sensory: No sensory deficit.     Motor: No weakness.     Coordination: Coordination normal.     Gait: Gait (Slow purposeful movements, but able to ambulate unassisted) normal.     Comments: Upper extremity myotomes tested bilaterally:  C5 Shoulder abduction 5/5 C6 Elbow flexion/wrist extension 5/5 C7 Elbow extension 5/5 C8 Finger flexion 5/5 T1 Finger abduction 5/5  Lower extremity myotomes tested bilaterally: L2 Hip flexion 5/5 L3 Knee extension 5/5 L4 Ankle dorsiflexion 5/5 S1 Ankle plantar flexion 5/5      ED Results / Procedures / Treatments   Labs (all labs ordered are listed, but only abnormal results are displayed) Labs Reviewed - No data to display  EKG None  Radiology No results  found.  Procedures Procedures    Medications Ordered in ED Medications  prochlorperazine (COMPAZINE) injection 10 mg (has no administration in time range)  diphenhydrAMINE (BENADRYL) injection 25 mg (has no administration in time range)    ED Course/ Medical Decision Making/ A&P    Patient seen and examined. History obtained directly from patient. Reviewed previous ED notes and most recent head CT results.   Labs/EKG: None ordered  Imaging: Ordered CT head wo contrast  Medications/Fluids: Ordered: IM compazine, PO benadryl, IM toradol.   Most recent vital signs reviewed and are as follows: BP (!) 140/91 (BP Location: Right Arm)   Pulse 65   Temp 98.6 F (  37 C) (Oral)   Resp 18   SpO2 95%   Initial impression: recurrent migraine HA  11:08 AM Reassessment performed. Patient appears comfortable.  She states that she is feeling better.  Imaging personally visualized and interpreted including: CT head without contrast, agree negative without acute findings.  Reviewed pertinent lab work and imaging with patient at bedside. Questions answered.   Most current vital signs reviewed and are as follows: BP (!) 140/91 (BP Location: Right Arm)   Pulse 65   Temp 98.6 F (37 C) (Oral)   Resp 18   SpO2 95%   Plan: Discharge to home.   Prescriptions written for: None  Other home care instructions discussed: Rest, avoidance of triggers  ED return instructions discussed: Patient counseled to return if they have weakness in their arms or legs, slurred speech, trouble walking or talking, confusion, trouble with their balance, or if they have any other concerns. Patient verbalizes understanding and agrees with plan.   Follow-up instructions discussed: Patient encouraged to follow-up with their PCP in 3 days. She is going to make appointment with her neurologist to discuss treatment options.                            Medical Decision Making Amount and/or Complexity of Data  Reviewed Radiology: ordered.  Risk Prescription drug management.   In regards to the patient's headache, critical differentials were considered including subarachnoid hemorrhage, intracerebral hemorrhage, epidural/subdural hematoma, pituitary apoplexy, vertebral/carotid artery dissection, giant cell arteritis, central venous thrombosis, reversible cerebral vasoconstriction, acute angle closure glaucoma, idiopathic intracranial hypertension, bacterial meningitis, viral encephalitis, carbon monoxide poisoning, posterior reversible encephalopathy syndrome, pre-eclampsia.   Reg flag symptoms related to these causes were considered including systemic symptoms (fever, weight loss), neurologic symptoms (confusion, mental status change, vision change, associated seizure), acute or sudden "thunderclap" onset, patient age 15 or older with new or progressive headache, patient of any age with first headache or change in headache pattern, pregnant or postpartum status, history of HIV or other immunocompromise, history of cancer, headache occurring with exertion, associated neck or shoulder pain, associated traumatic injury, concurrent use of anticoagulation, family history of spontaneous SAH, and concurrent drug use.    Other benign, more common causes of headache were considered including migraine, tension-type headache, cluster headache, referred pain from other cause such as sinus infection, dental pain, trigeminal neuralgia.   On exam, patient has a reassuring neuro exam including baseline mental status, no significant neck pain or meningeal signs, no signs of severe infection or fever.  Disequilibrium is mild.  Low concern for acute CVA causing this at this time given her history and reassuring exam.  The patient's vital signs, pertinent lab work and imaging were reviewed and interpreted as discussed in the ED course. Hospitalization was considered for further testing, treatments, or serial exams/observation.  However as patient is well-appearing, has a stable exam over the course of their evaluation, and reassuring studies today, I do not feel that they warrant admission at this time. 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          Final Clinical Impression(s) / ED Diagnoses Final diagnoses:  Migraine without status migrainosus, not intractable, unspecified migraine type    Rx / DC Orders ED Discharge Orders  None         Carlisle Cater, Hershal Coria 02/27/22 [place]    Fransico Meadow, MD 02/27/22 [phone]

## 2022-02-27 NOTE — Discharge Instructions (Signed)
Please read and follow all provided instructions.  Your diagnoses today include:  1. Migraine without status migrainosus, not intractable, unspecified migraine type     Tests performed today include: CT of your head which was normal and did not show any serious cause of your headache Vital signs. See below for your results today.   Medications:  In the Emergency Department you received: Compazine - antinausea/headache medication Benadryl - antihistamine to counteract potential side effects of reglan Toradol - NSAID medication similar to ibuprofen  Take any prescribed medications only as directed.  Additional information:  Follow any educational materials contained in this packet.  You are having a headache. No specific cause was found today for your headache. It may have been a migraine or other cause of headache. Stress, anxiety, fatigue, and depression are common triggers for headaches.   Your headache today does not appear to be life-threatening or require hospitalization, but often the exact cause of headaches is not determined in the emergency department. Therefore, follow-up with your doctor is very important to find out what may have caused your headache and whether or not you need any further diagnostic testing or treatment.   Sometimes headaches can appear benign (not harmful), but then more serious symptoms can develop which should prompt an immediate re-evaluation by your doctor or the emergency department.  BE VERY CAREFUL not to take multiple medicines containing Tylenol (also called acetaminophen). Doing so can lead to an overdose which can damage your liver and cause liver failure and possibly death.   Follow-up instructions: Please follow-up with your primary care provider in the next 3 days for further evaluation of your symptoms.   Return instructions:  Please return to the Emergency Department if you experience worsening symptoms. Return if the medications do not  resolve your headache, if it recurs, or if you have multiple episodes of vomiting or cannot keep down fluids. Return if you have a change from the usual headache. RETURN IMMEDIATELY IF you: Develop a sudden, severe headache Develop confusion or become poorly responsive or faint Develop a fever above 100.61F or problem breathing Have a change in speech, vision, swallowing, or understanding Develop new weakness, numbness, tingling, incoordination in your arms or legs Have a seizure Please return if you have any other emergent concerns.  Additional Information:  Your vital signs today were: BP (!) 140/91 (BP Location: Right Arm)   Pulse 65   Temp 98.6 F (37 C) (Oral)   Resp 18   SpO2 95%  If your blood pressure (BP) was elevated above 135/85 this visit, please have this repeated by your doctor within one month. --------------

## 2022-02-27 NOTE — ED Triage Notes (Signed)
Pt arrives pov, slow gait, endorses HA today, tx for same x 2 days pta. Also reports "equilibrium off when walking" x 3 days. Endorses nausea. PT AOx 4

## 2022-03-11 ENCOUNTER — Ambulatory Visit (HOSPITAL_COMMUNITY)
Admission: EM | Admit: 2022-03-11 | Discharge: 2022-03-11 | Disposition: A | Discharge status: Home / Self Care | Payer: BC Managed Care – PPO | Attending: Emergency Medicine | Admitting: Emergency Medicine

## 2022-03-11 ENCOUNTER — Encounter (HOSPITAL_COMMUNITY): Payer: Self-pay | Admitting: Emergency Medicine

## 2022-03-11 DIAGNOSIS — G43801 Other migraine, not intractable, with status migrainosus: Secondary | ICD-10-CM

## 2022-03-11 MED ORDER — METHYLPREDNISOLONE SODIUM SUCC 125 MG IJ SOLR
60.0000 mg | Freq: Once | INTRAMUSCULAR | Status: AC
  Administered 2022-03-11: 60 mg via INTRAMUSCULAR

## 2022-03-11 MED ORDER — ONDANSETRON 4 MG PO TBDP: 4.0000 mg | ORAL_TABLET | Freq: Four times a day (QID) | ORAL | 0 refills | Status: DC | PRN

## 2022-03-11 MED ORDER — METHYLPREDNISOLONE SODIUM SUCC 125 MG IJ SOLR
INTRAMUSCULAR | Status: AC
  Filled 2022-03-11: qty 2

## 2022-03-11 NOTE — ED Triage Notes (Signed)
Pt reports that she has had migraine for over a week. Seen last Friday and Saturday in ED for migraine cocktail. Reports sensitivity to light and having nausea. Took Zofran around 815 today. Reports that had virtual visit with Neurology on Wed and had an MRI done and was told to stop taking Excedrin. Reports had stopped her migraine medications while back and was trying to treat holistically.

## 2022-03-11 NOTE — ED Provider Notes (Signed)
[facility]    CSN: [id number] Arrival date & time: 03/11/22  0920      History   Chief Complaint Chief Complaint  Patient presents with   Migraine    HPI Ellen Hood is a 51 y.o. female.  Presents with 1 week history of migraine 10/10 pain in the head, throbbing Nausea, photosensitivity Denies numbness or tingling in the extremities, weakness No vision changes, no head injury  Zofran about an hour before presentation to UC  Seen in ED 10/6 in 10/8 for migraine, given headache cocktail that temporarily helped  Had virtual neurology visit 2 days ago, they performed MRI and should be getting back to her with the results soon.  Advised to stop taking daily Excedrin due to rebound headache.  She used to take daily migraine preventative many years ago, stopped this and has been doing holistic approach.  Was doing well into the last few months migraines seem to be 4 times weekly She cannot take Imitrex due to side effect of palpitations  History of hemiplegic migraine  Past Medical History:  Diagnosis Date   Back pain    Endometriosis    Migraine    PE (pulmonary embolism)     Patient Active Problem List   Diagnosis Date Noted   Dyspnea 11/28/2014   Pulmonary embolism ([place]) 11/27/2014    Past Surgical History:  Procedure Laterality Date   ABDOMINAL HYSTERECTOMY     BUNIONECTOMY Right    CESAREAN SECTION     HERNIA REPAIR     LAPAROSCOPY      OB History   No obstetric history on file.      Home Medications    Prior to Admission medications   Medication Sig Start Date End Date Taking? Authorizing Provider  ondansetron (ZOFRAN-ODT) 4 MG disintegrating tablet Take 1 tablet (4 mg total) by mouth every 6 (six) hours as needed for nausea or vomiting. 03/11/22  Yes Chrystine Frogge, PA-C  albuterol (PROVENTIL HFA;VENTOLIN HFA) 108 (90 BASE) MCG/ACT inhaler Inhale into the lungs. 11/30/14 11/30/15  [provider]   amphetamine-dextroamphetamine (ADDERALL) 20 MG tablet Take 1 tablet (20 mg total) by mouth 2 (two) times daily. 06/02/20   Mozingo, Berdie Ogren, NP  diphenhydrAMINE (BENADRYL) 25 MG tablet Take 25 mg by mouth at bedtime.      [provider]  naproxen (NAPROSYN) 500 MG tablet Take 1 tablet (500 mg total) by mouth 2 (two) times daily. 06/24/17   Petrucelli, Glynda Jaeger, PA-C    Family History No family history on file.  Social History Social History   Tobacco Use   Smoking status: Never   Smokeless tobacco: Never  Vaping Use   Vaping Use: Never used  Substance Use Topics   Alcohol use: No    Alcohol/week: 0.0 standard drinks of alcohol   Drug use: No     Allergies   Topiramate   Review of Systems Review of Systems Per HPI  Physical Exam Triage Vital Signs ED Triage Vitals  Enc Vitals Group     BP 03/11/22 0951 (!) 141/92     Pulse Rate 03/11/22 0951 62     Resp 03/11/22 0951 16     Temp 03/11/22 0951 98 F (36.7 C)     Temp Source 03/11/22 0951 Oral     SpO2 03/11/22 0951 98 %     Weight --      Height --      Head Circumference --  Peak Flow --      Pain Score 03/11/22 0952 10     Pain Loc --      Pain Edu? --      Excl. in [place]? --    No data found.  Updated Vital Signs BP (!) 141/92 (BP Location: Left Arm)   Pulse 62   Temp 98 F (36.7 C) (Oral)   Resp 16   SpO2 98%    Physical Exam Vitals and nursing note reviewed.  Constitutional:      General: She is not in acute distress.    Comments: Sitting on exam table in no acute distress, lights off  HENT:     Head: Normocephalic and atraumatic.     Mouth/Throat:     Mouth: Mucous membranes are moist.     Pharynx: Oropharynx is clear.  Eyes:     Extraocular Movements: Extraocular movements intact.     Conjunctiva/sclera: Conjunctivae normal.     Pupils: Pupils are equal, round, and reactive to light.  Cardiovascular:     Rate and Rhythm: Normal rate and regular rhythm.     Heart  sounds: Normal heart sounds.  Pulmonary:     Effort: Pulmonary effort is normal. No respiratory distress.     Breath sounds: Normal breath sounds.  Musculoskeletal:        General: Normal range of motion.     Cervical back: Normal range of motion. No rigidity.  Neurological:     General: No focal deficit present.     Mental Status: She is alert and oriented to person, place, and time.     Cranial Nerves: Cranial nerves 2-12 are intact. No cranial nerve deficit or facial asymmetry.     Sensory: Sensation is intact. No sensory deficit.     Motor: Motor function is intact. No weakness.     Coordination: Coordination is intact. Coordination normal.     Gait: Gait is intact. Gait normal.     Comments: Strength 5/5 all extremities, sensation intact    UC Treatments / Results  Labs (all labs ordered are listed, but only abnormal results are displayed) Labs Reviewed - No data to display  EKG   Radiology No results found.  Procedures Procedures (including critical care time)  Medications Ordered in UC Medications  methylPREDNISolone sodium succinate (SOLU-MEDROL) 125 mg/2 mL injection 60 mg (60 mg Intramuscular Given 03/11/22 1055)    Initial Impression / Assessment and Plan / UC Course  I have reviewed the triage vital signs and the nursing notes.  Pertinent labs & imaging results that were available during my care of the patient were reviewed by me and considered in my medical decision making (see chart for details).  Neurologically intact, CN II through XII intact Discussed with patient possible treatment options today She has already taking Zofran to control nausea, cannot take Imitrex, do not recommend Tylenol or NSAIDs due to the rebound headache effect.  At this time steroid injection is our best option. IM Solu-Medrol dose given Patient reports migraine starting to fade away about 30 minutes after injection given Recommend patient call neurology today regarding MRI  results and further outpatient treatment Sent zofran 4mg  to use prn for nausea She is well-appearing and stable for discharge at this time, discussed any worsening symptoms need to be evaluated in the ED.  Patient agrees to plan  Final Clinical Impressions(s) / UC Diagnoses   Final diagnoses:  Other migraine with status migrainosus, not intractable  Discharge Instructions      Zofran can be used every 6 hours as needed. Please drink lots of fluids.  Follow up with your neurologist regarding symptoms and medications.      ED Prescriptions     Medication Sig Dispense Auth. Provider   ondansetron (ZOFRAN-ODT) 4 MG disintegrating tablet Take 1 tablet (4 mg total) by mouth every 6 (six) hours as needed for nausea or vomiting. 20 tablet Clarion Mooneyhan, Wells Guiles, PA-C      PDMP not reviewed this encounter.   Anneke Cundy, Wells Guiles, PA-C 03/11/22 1250

## 2022-03-11 NOTE — Discharge Instructions (Signed)
Zofran can be used every 6 hours as needed. Please drink lots of fluids.  Follow up with your neurologist regarding symptoms and medications.

## 2022-03-29 ENCOUNTER — Encounter (HOSPITAL_BASED_OUTPATIENT_CLINIC_OR_DEPARTMENT_OTHER): Payer: Self-pay | Admitting: Emergency Medicine

## 2022-03-29 ENCOUNTER — Telehealth: Payer: BC Managed Care – PPO

## 2022-03-29 ENCOUNTER — Emergency Department (HOSPITAL_BASED_OUTPATIENT_CLINIC_OR_DEPARTMENT_OTHER)
Admission: EM | Admit: 2022-03-29 | Discharge: 2022-03-29 | Disposition: A | Discharge status: Home / Self Care | Payer: BC Managed Care – PPO | Attending: Emergency Medicine | Admitting: Emergency Medicine

## 2022-03-29 ENCOUNTER — Emergency Department (HOSPITAL_BASED_OUTPATIENT_CLINIC_OR_DEPARTMENT_OTHER): Payer: BC Managed Care – PPO

## 2022-03-29 DIAGNOSIS — R1031 Right lower quadrant pain: Secondary | ICD-10-CM | POA: Insufficient documentation

## 2022-03-29 DIAGNOSIS — R350 Frequency of micturition: Secondary | ICD-10-CM | POA: Diagnosis not present

## 2022-03-29 DIAGNOSIS — R109 Unspecified abdominal pain: Secondary | ICD-10-CM

## 2022-03-29 LAB — BASIC METABOLIC PANEL
Anion gap: 9 (ref 5–15)
BUN: 15 mg/dL (ref 6–20)
CO2: 25 mmol/L (ref 22–32)
Calcium: 9 mg/dL (ref 8.9–10.3)
Chloride: 104 mmol/L (ref 98–111)
Creatinine, Ser: 1.11 mg/dL — ABNORMAL HIGH (ref 0.44–1.00)
GFR, Estimated: >60 mL/min (ref >60)
Glucose, Bld: 101 mg/dL — ABNORMAL HIGH (ref 70–99)
Potassium: 4.2 mmol/L (ref 3.5–5.1)
Sodium: 138 mmol/L (ref 135–145)

## 2022-03-29 LAB — CBC
HCT: 41.3 % (ref 36.0–46.0)
Hemoglobin: 13.4 g/dL (ref 12.0–15.0)
MCH: 28.6 pg (ref 26.0–34.0)
MCHC: 32.4 g/dL (ref 30.0–36.0)
MCV: 88.1 fL (ref 80.0–100.0)
Platelets: 328 10*3/uL (ref 150–400)
RBC: 4.69 MIL/uL (ref 3.87–5.11)
RDW: 15.8 % — ABNORMAL HIGH (ref 11.5–15.5)
WBC: 7.6 10*3/uL (ref 4.0–10.5)
nRBC: 0 % (ref 0.0–0.2)

## 2022-03-29 LAB — URINALYSIS, ROUTINE W REFLEX MICROSCOPIC
Bilirubin Urine: NEGATIVE
Glucose, UA: NEGATIVE mg/dL
Ketones, ur: NEGATIVE mg/dL
Leukocytes,Ua: NEGATIVE
Nitrite: NEGATIVE
Protein, ur: NEGATIVE mg/dL
Specific Gravity, Urine: 1.02 (ref 1.005–1.030)
pH: 7.5 (ref 5.0–8.0)

## 2022-03-29 LAB — URINALYSIS, MICROSCOPIC (REFLEX): WBC, UA: NONE SEEN WBC/hpf (ref 0–5)

## 2022-03-29 MED ORDER — CEPHALEXIN 500 MG PO CAPS: ORAL_CAPSULE | ORAL | 0 refills | Status: AC

## 2022-03-29 MED ORDER — OXYCODONE-ACETAMINOPHEN 5-325 MG PO TABS
1.0000 | ORAL_TABLET | ORAL | Status: DC | PRN
  Administered 2022-03-29: 1 via ORAL
  Filled 2022-03-29: qty 1

## 2022-03-29 MED ORDER — KETOROLAC TROMETHAMINE 15 MG/ML IJ SOLN
15.0000 mg | Freq: Once | INTRAMUSCULAR | Status: DC
  Filled 2022-03-29: qty 1

## 2022-03-29 MED ORDER — ONDANSETRON 4 MG PO TBDP: ORAL_TABLET | ORAL | 0 refills | Status: AC

## 2022-03-29 MED ORDER — KETOROLAC TROMETHAMINE 15 MG/ML IJ SOLN
15.0000 mg | Freq: Once | INTRAMUSCULAR | Status: AC
  Administered 2022-03-29: 15 mg via INTRAVENOUS

## 2022-03-29 NOTE — ED Triage Notes (Signed)
Recently dx with UTI and placed on Abx. Has been taking abx for 2 days. Reports worsening right flank pain with n/v. Hx of pyelonephritis. Reports feels the same.

## 2022-03-29 NOTE — ED Provider Notes (Signed)
[facility] EMERGENCY DEPARTMENT Provider Note   CSN: [id number] Arrival date & time: 03/29/22  1524     History  Chief Complaint  Patient presents with   Flank Pain    Ellen Hood is a 51 y.o. female.  51 yo F with a chief complaints of right flank pain increased urinary frequency and hesitancy.  Has been going on for about 3 or 4 days.  Having fevers off and on as well.  She was seen by her family doctor and diagnosed with a kidney infection and was started on nitrofurantoin.  She is now on day 3 of that and feels like her symptoms have worsened.  Has had pyelonephritis multiple times in the past.  Has been hospitalized for it twice.   Flank Pain       Home Medications Prior to Admission medications   Medication Sig Start Date End Date Taking? Authorizing Provider  cephALEXin (KEFLEX) 500 MG capsule 2 caps po bid x 7 days 03/29/22  Yes Melene Plan, DO  ondansetron (ZOFRAN-ODT) 4 MG disintegrating tablet 4mg  ODT q4 hours prn nausea/vomit 03/29/22  Yes 13/7/23, Lizbeth Feijoo, DO  albuterol (PROVENTIL HFA;VENTOLIN HFA) 108 (90 BASE) MCG/ACT inhaler Inhale into the lungs. 11/30/14 11/30/15  [provider]  amphetamine-dextroamphetamine (ADDERALL) 20 MG tablet Take 1 tablet (20 mg total) by mouth 2 (two) times daily. 06/02/20   Mozingo, 07/31/20, NP  diphenhydrAMINE (BENADRYL) 25 MG tablet Take 25 mg by mouth at bedtime.      [provider]  naproxen (NAPROSYN) 500 MG tablet Take 1 tablet (500 mg total) by mouth 2 (two) times daily. 06/24/17   Petrucelli, Samantha R, PA-C      Allergies    Topiramate    Review of Systems   Review of Systems  Genitourinary:  Positive for flank pain.    Physical Exam Updated Vital Signs BP 116/70 (BP Location: Right Arm)   Pulse (!) 103   Temp 98.3 F (36.8 C) (Oral)   Resp 16   Ht 5' 5.5" (1.664 m)   Wt 94.3 kg   SpO2 96%   BMI 34.09 kg/m  Physical Exam Vitals and nursing note reviewed.  Constitutional:       General: She is not in acute distress.    Appearance: She is well-developed. She is not diaphoretic.  HENT:     Head: Normocephalic and atraumatic.  Eyes:     Pupils: Pupils are equal, round, and reactive to light.  Cardiovascular:     Rate and Rhythm: Normal rate and regular rhythm.     Heart sounds: No murmur heard.    No friction rub. No gallop.  Pulmonary:     Effort: Pulmonary effort is normal.     Breath sounds: No wheezing or rales.  Abdominal:     General: There is no distension.     Palpations: Abdomen is soft.     Tenderness: There is no abdominal tenderness. There is no right CVA tenderness or left CVA tenderness.     Comments: No CVA tenderness.  Benign abdominal exam.  Musculoskeletal:        General: No tenderness.     Cervical back: Normal range of motion and neck supple.  Skin:    General: Skin is warm and dry.  Neurological:     Mental Status: She is alert and oriented to person, place, and time.  Psychiatric:        Behavior: Behavior normal.     ED  Results / Procedures / Treatments   Labs (all labs ordered are listed, but only abnormal results are displayed) Labs Reviewed  URINALYSIS, ROUTINE W REFLEX MICROSCOPIC - Abnormal; Notable for the following components:      Result Value   Hgb urine dipstick TRACE (*)    All other components within normal limits  BASIC METABOLIC PANEL - Abnormal; Notable for the following components:   Glucose, Bld 101 (*)    Creatinine, Ser 1.11 (*)    All other components within normal limits  CBC - Abnormal; Notable for the following components:   RDW 15.8 (*)    All other components within normal limits  URINALYSIS, MICROSCOPIC (REFLEX) - Abnormal; Notable for the following components:   Bacteria, UA RARE (*)    All other components within normal limits  URINE CULTURE    EKG None  Radiology CT Renal Stone Study  Result Date: 03/29/2022 CLINICAL DATA:  Flank pain EXAM: CT ABDOMEN AND PELVIS WITHOUT CONTRAST  TECHNIQUE: Multidetector CT imaging of the abdomen and pelvis was performed following the standard protocol without IV contrast. RADIATION DOSE REDUCTION: This exam was performed according to the departmental dose-optimization program which includes automated exposure control, adjustment of the mA and/or kV according to patient size and/or use of iterative reconstruction technique. COMPARISON:  08/25/2018 FINDINGS: Lower chest: There are small linear densities in the lower lung fields suggesting minimal scarring or subsegmental atelectasis. Hepatobiliary: No focal abnormalities are seen in liver. There is no dilation of bile ducts. Gallbladder is unremarkable. Pancreas: Pancreas appears smaller than usual in size. No focal abnormalities are seen. Spleen: Unremarkable. Adrenals/Urinary Tract: Adrenals are not enlarged. There is no hydronephrosis. There are no renal or ureteral stones. Urinary bladder is almost completely empty limiting evaluation. Stomach/Bowel: Stomach is not distended. Small bowel loops are not dilated. Appendix is not dilated. There is no significant wall thickening in colon. There is no pericolic stranding. Vascular/Lymphatic: Vascular structures are unremarkable. There are subcentimeter nodes in mesentery and retroperitoneum with no significant interval change. Reproductive: Uterus is not seen.  There are no adnexal masses. Other: There is no ascites or pneumoperitoneum. Musculoskeletal: Unremarkable. IMPRESSION: There is no evidence of intestinal obstruction or pneumoperitoneum. There is no hydronephrosis. Appendix is not dilated. Electronically Signed   By: Ernie Avena M.D.   On: 03/29/2022 16:40    Procedures Procedures    Medications Ordered in ED Medications  oxyCODONE-acetaminophen (PERCOCET/ROXICET) 5-325 MG per tablet 1 tablet (1 tablet Oral Given 03/29/22 1559)  ketorolac (TORADOL) 15 MG/ML injection 15 mg (has no administration in time range)    ED Course/ Medical  Decision Making/ A&P                           Medical Decision Making Amount and/or Complexity of Data Reviewed Labs: ordered. Radiology: ordered.  Risk Prescription drug management.   51 yo F with a chief complaints of right flank pain increased urinary frequency and hesitancy and going on for about 3 to 4 days.  Having fevers and chills as well.  She is concerned for pyelonephritis and has had this twice in the past that required hospitalization.  UA here is negative though could be falsely negative due to being on nitrofurantoin.  No significant change to her renal function no electrolyte abnormality.  CT stone study was ordered and is negative for obvious intra-abdominal pathology.  We will change antibiotics to Keflex.  Have her follow-up with family doctor  in the office.  7:51 PM:  I have discussed the diagnosis/risks/treatment options with the patient.  Evaluation and diagnostic testing in the emergency department does not suggest an emergent condition requiring admission or immediate intervention beyond what has been performed at this time.  They will follow up with PCP. We also discussed returning to the ED immediately if new or worsening sx occur. We discussed the sx which are most concerning (e.g., sudden worsening pain, fever, inability to tolerate by mouth) that necessitate immediate return. Medications administered to the patient during their visit and any new prescriptions provided to the patient are listed below.  Medications given during this visit Medications  oxyCODONE-acetaminophen (PERCOCET/ROXICET) 5-325 MG per tablet 1 tablet (1 tablet Oral Given 03/29/22 1559)  ketorolac (TORADOL) 15 MG/ML injection 15 mg (has no administration in time range)     The patient appears reasonably screen and/or stabilized for discharge and I doubt any other medical condition or other [facility] requiring further screening, evaluation, or treatment in the ED at this time prior to discharge.           Final Clinical Impression(s) / ED Diagnoses Final diagnoses:  Flank pain    Rx / DC Orders ED Discharge Orders          Ordered    cephALEXin (KEFLEX) 500 MG capsule        03/29/22 1948    ondansetron (ZOFRAN-ODT) 4 MG disintegrating tablet        03/29/22 1948              Deno Etienne, DO 03/29/22 1951

## 2022-03-29 NOTE — ED Notes (Signed)
ED Provider at bedside. 

## 2022-03-29 NOTE — Discharge Instructions (Signed)
Follow up with your PCP.  Return for inability to eat or drink, persistent fever >5 days, sudden worsening abdominal pain.  Take 4 over the counter ibuprofen tablets 3 times a day or 2 over-the-counter naproxen tablets twice a day for pain. Also take tylenol 1000mg (2 extra strength) four times a day.

## 2022-03-30 LAB — URINE CULTURE

## 2022-04-05 ENCOUNTER — Institutional Professional Consult (permissible substitution): Payer: BC Managed Care – PPO | Admitting: Pulmonary Disease

## 2022-07-14 ENCOUNTER — Other Ambulatory Visit: Payer: Self-pay

## 2022-07-14 ENCOUNTER — Emergency Department (HOSPITAL_BASED_OUTPATIENT_CLINIC_OR_DEPARTMENT_OTHER): Payer: BC Managed Care – PPO

## 2022-07-14 ENCOUNTER — Encounter (HOSPITAL_BASED_OUTPATIENT_CLINIC_OR_DEPARTMENT_OTHER): Payer: Self-pay | Admitting: Urology

## 2022-07-14 ENCOUNTER — Emergency Department (HOSPITAL_BASED_OUTPATIENT_CLINIC_OR_DEPARTMENT_OTHER)
Admission: EM | Admit: 2022-07-14 | Discharge: 2022-07-14 | Disposition: A | Discharge status: Home / Self Care | Payer: BC Managed Care – PPO | Attending: Emergency Medicine | Admitting: Emergency Medicine

## 2022-07-14 DIAGNOSIS — M940 Chondrocostal junction syndrome [Tietze]: Secondary | ICD-10-CM | POA: Diagnosis not present

## 2022-07-14 DIAGNOSIS — R079 Chest pain, unspecified: Secondary | ICD-10-CM | POA: Insufficient documentation

## 2022-07-14 DIAGNOSIS — Z7982 Long term (current) use of aspirin: Secondary | ICD-10-CM | POA: Insufficient documentation

## 2022-07-14 LAB — BASIC METABOLIC PANEL
Anion gap: 5 (ref 5–15)
BUN: 13 mg/dL (ref 6–20)
CO2: 29 mmol/L (ref 22–32)
Calcium: 8.6 mg/dL — ABNORMAL LOW (ref 8.9–10.3)
Chloride: 102 mmol/L (ref 98–111)
Creatinine, Ser: 0.91 mg/dL (ref 0.44–1.00)
GFR, Estimated: >60 mL/min (ref >60)
Glucose, Bld: 93 mg/dL (ref 70–99)
Potassium: 3.9 mmol/L (ref 3.5–5.1)
Sodium: 136 mmol/L (ref 135–145)

## 2022-07-14 LAB — CBC
HCT: 38.5 % (ref 36.0–46.0)
Hemoglobin: 12.5 g/dL (ref 12.0–15.0)
MCH: 28.5 pg (ref 26.0–34.0)
MCHC: 32.5 g/dL (ref 30.0–36.0)
MCV: 87.7 fL (ref 80.0–100.0)
Platelets: 305 10*3/uL (ref 150–400)
RBC: 4.39 MIL/uL (ref 3.87–5.11)
RDW: 14.6 % (ref 11.5–15.5)
WBC: 7.4 10*3/uL (ref 4.0–10.5)
nRBC: 0 % (ref 0.0–0.2)

## 2022-07-14 LAB — TROPONIN I (HIGH SENSITIVITY)
Troponin I (High Sensitivity): <2 ng/L (ref <18)
Troponin I (High Sensitivity): <2 ng/L (ref <18)

## 2022-07-14 LAB — D-DIMER, QUANTITATIVE: D-Dimer, Quant: <0.27 ug/mL-FEU (ref 0.00–0.50)

## 2022-07-14 MED ORDER — ASPIRIN 81 MG PO CHEW
243.0000 mg | CHEWABLE_TABLET | Freq: Once | ORAL | Status: AC
  Administered 2022-07-14: 243 mg via ORAL
  Filled 2022-07-14: qty 3

## 2022-07-14 MED ORDER — ACETAMINOPHEN 500 MG PO TABS
1000.0000 mg | ORAL_TABLET | ORAL | Status: AC
  Administered 2022-07-14: 1000 mg via ORAL
  Filled 2022-07-14: qty 2

## 2022-07-14 NOTE — Discharge Instructions (Signed)
You were seen for your chest pain in the emergency department.  It may be due to a muscle strain or condition called costochondritis.  At home, please take Tylenol and ibuprofen for your pain.    Follow-up with your primary doctor in 2-3 days regarding your visit.  Cardiology will be calling you regarding an appointment within the next 72 hours.  You may contact them if you do not hear from them in that time using the information in this packet.  Return immediately to the emergency department if you experience any of the following: Worsening pain, difficulty breathing, unexplained vomiting or sweating, or any other concerning symptoms.    Thank you for visiting our Emergency Department. It was a pleasure taking care of you today.

## 2022-07-14 NOTE — ED Triage Notes (Addendum)
Pt states central chest pain x 1.5 hrs, states mild headache Pain worse with movement  Denies SOB    H/o PE. Took asa

## 2022-07-14 NOTE — ED Provider Notes (Signed)
[facility] Provider Note   CSN: [id number] Arrival date & time: 07/14/22  1250     History  Chief Complaint  Patient presents with   Chest Pain    Ellen Hood is a 51 y.o. female.  52 year old female with a history of pulmonary embolism on daily aspirin who presents emergency department with chest pain.  Patient reports that last week she had spontaneous resolving episode of chest pain.  Today started experiencing chest pain again.  Describes it as substernal and sharp.  Says it is not pleuritic.  Not exertional. Worsened with movement especially when sitting up or standing.  Not worsened with moving her arms and does not recall any injuries.  No unilateral lower extremity swelling.  No personal history of MI.  No shortness of breath, cough, fevers, chills recently.  Did take 81 mg of aspirin today and had an EKG at her workplace that showed T wave inversions and she was referred into the emergency department.  Mother had a history of MI in her mid 34s.  No smoking history.  Does not follow with cardiology currently.       Home Medications Prior to Admission medications   Medication Sig Start Date End Date Taking? Authorizing Provider  albuterol (PROVENTIL HFA;VENTOLIN HFA) 108 (90 BASE) MCG/ACT inhaler Inhale into the lungs. 11/30/14 11/30/15  [provider]  amphetamine-dextroamphetamine (ADDERALL) 20 MG tablet Take 1 tablet (20 mg total) by mouth 2 (two) times daily. 06/02/20   Mozingo, Berdie Ogren, NP  cephALEXin (KEFLEX) 500 MG capsule 2 caps po bid x 7 days 03/29/22   Deno Etienne, DO  diphenhydrAMINE (BENADRYL) 25 MG tablet Take 25 mg by mouth at bedtime.      [provider]  naproxen (NAPROSYN) 500 MG tablet Take 1 tablet (500 mg total) by mouth 2 (two) times daily. 06/24/17   Petrucelli, Glynda Jaeger, PA-C  ondansetron (ZOFRAN-ODT) 4 MG disintegrating tablet '4mg'$  ODT q4 hours prn nausea/vomit 03/29/22   Deno Etienne, DO      Allergies    Topiramate    Review of Systems   Review of Systems  Physical Exam Updated Vital Signs BP 137/89   Pulse 72   Temp 97.9 F (36.6 C) (Oral)   Resp 18   Ht '5\' 5"'$  (1.651 m)   Wt 90.7 kg   SpO2 99%   BMI 33.28 kg/m  Physical Exam Vitals and nursing note reviewed.  Constitutional:      General: She is not in acute distress.    Appearance: She is well-developed.  HENT:     Head: Normocephalic and atraumatic.     Right Ear: External ear normal.     Left Ear: External ear normal.     Nose: Nose normal.  Eyes:     Extraocular Movements: Extraocular movements intact.     Conjunctiva/sclera: Conjunctivae normal.     Pupils: Pupils are equal, round, and reactive to light.  Cardiovascular:     Rate and Rhythm: Normal rate and regular rhythm.     Heart sounds: No murmur heard.    Comments: Chest pain reproducible Pulmonary:     Effort: Pulmonary effort is normal. No respiratory distress.     Breath sounds: Normal breath sounds.  Musculoskeletal:     Cervical back: Normal range of motion and neck supple.     Right lower leg: No edema.     Left lower leg: No edema.  Skin:  General: Skin is warm and dry.  Neurological:     Mental Status: She is alert and oriented to person, place, and time. Mental status is at baseline.  Psychiatric:        Mood and Affect: Mood normal.     ED Results / Procedures / Treatments   Labs (all labs ordered are listed, but only abnormal results are displayed) Labs Reviewed  BASIC METABOLIC PANEL - Abnormal; Notable for the following components:      Result Value   Calcium 8.6 (*)    All other components within normal limits  CBC  D-DIMER, QUANTITATIVE (NOT AT [facility])  TROPONIN I (HIGH SENSITIVITY)  TROPONIN I (HIGH SENSITIVITY)    EKG EKG Interpretation  Date/Time:  Thursday July 14 2022 12:59:37 EST Ventricular Rate:  83 PR Interval:  143 QRS Duration: 94 QT Interval:  432 QTC Calculation: 508 R  Axis:   62 Text Interpretation: Sinus rhythm Low voltage, precordial leads Nonspecific T abnormalities, anterior leads Borderline prolonged QT interval When compared to 04/13/20 QT interval has improved Confirmed by Margaretmary Eddy [phone]) on 07/14/2022 2:09:25 PM  Radiology DG Chest 2 View  Result Date: 07/14/2022 CLINICAL DATA:  chest pain EXAM: CHEST - 2 VIEW COMPARISON:  10/09/2019 FINDINGS: Cardiac silhouette is unremarkable. No pneumothorax or pleural effusion. The lungs are clear. The visualized skeletal structures are unremarkable. IMPRESSION: No acute cardiopulmonary process. Electronically Signed   By: Sammie Bench M.D.   On: 07/14/2022 13:27    Procedures Procedures   Medications Ordered in ED Medications  aspirin chewable tablet 243 mg (243 mg Oral Given 07/14/22 1438)  acetaminophen (TYLENOL) tablet 1,000 mg (1,000 mg Oral Given 07/14/22 1438)    ED Course/ Medical Decision Making/ A&P                            Medical Decision Making Amount and/or Complexity of Data Reviewed Labs: ordered. Radiology: ordered.  Risk OTC drugs.   Ellen Hood is a 52 y.o. female with comorbidities that complicate the patient evaluation including prior PE not on anticoagulation presents to the emergency department with chest pain that is reproducible and pleuritic  Initial Ddx:  PE, costochondritis, pleurisy, MSK pain, MI  MDM:  The patient is likely having either costochondritis or some form of musculoskeletal pain given her symptoms and the fact that is very reproducible on exam.  No overlying rashes.  Does have a history of PE so will obtain D-dimer to assess for possible recurrent PE.  Chest pain not consistent with MI.  Was concerned about this given her EKG at her primary care office so we will send troponins and repeat EKG at this time.  Plan:  Labs Troponin D-dimer EKG Chest x-ray  ED Summary/Re-evaluation:  Patient underwent the above workup that was  unremarkable.  Feel that her symptoms are due to MSK pain so was instructed to take Tylenol and NSAIDs for pain.  Return precautions discussed prior to discharge.  Instructed follow-up with her primary care doctor in 2 to 3 days and I did place ambulatory referral to cardiology for the patient.  This patient presents to the ED for concern of complaints listed in HPI, this involves an extensive number of treatment options, and is a complaint that carries with it a high risk of complications and morbidity. Disposition including potential need for admission considered.   Dispo: DC Home. Return precautions discussed including, but not limited to, those listed  in the AVS. Allowed pt time to ask questions which were answered fully prior to dc.  Additional history obtained from spouse Records reviewed Outpatient Clinic Notes The following labs were independently interpreted: Serial Troponins and show no acute abnormality I independently reviewed the following imaging with scope of interpretation limited to determining acute life threatening conditions related to emergency care: Chest x-ray and agree with the radiologist interpretation with the following exceptions: None I personally reviewed and interpreted cardiac monitoring: normal sinus rhythm  I personally reviewed and interpreted the pt's EKG: see above for interpretation  I have reviewed the patients home medications and made adjustments as needed  Final Clinical Impression(s) / ED Diagnoses Final diagnoses:  Chest pain, unspecified type  Costochondritis    Rx / DC Orders ED Discharge Orders          Ordered    Ambulatory referral to Cardiology        07/14/22 1555              Fransico Meadow, MD 07/16/22 1207

## 2022-08-30 ENCOUNTER — Ambulatory Visit: Payer: BC Managed Care – PPO | Attending: Cardiology | Admitting: Cardiology

## 2023-04-30 ENCOUNTER — Telehealth: Payer: 59 | Admitting: Family Medicine

## 2023-04-30 DIAGNOSIS — N390 Urinary tract infection, site not specified: Secondary | ICD-10-CM

## 2023-04-30 MED ORDER — NITROFURANTOIN MONOHYD MACRO 100 MG PO CAPS: 100.0000 mg | ORAL_CAPSULE | Freq: Two times a day (BID) | ORAL | 0 refills | Status: AC

## 2023-04-30 NOTE — Progress Notes (Signed)
Virtual Visit Consent   Ellen Hood, you are scheduled for a virtual visit with a [facility] provider today. Just as with appointments in the office, your consent must be obtained to participate. Your consent will be active for this visit and any virtual visit you may have with one of our providers in the next 365 days. If you have a MyChart account, a copy of this consent can be sent to you electronically.  As this is a virtual visit, video technology does not allow for your provider to perform a traditional examination. This may limit your provider's ability to fully assess your condition. If your provider identifies any concerns that need to be evaluated in person or the need to arrange testing (such as labs, EKG, etc.), we will make arrangements to do so. Although advances in technology are sophisticated, we cannot ensure that it will always work on either your end or our end. If the connection with a video visit is poor, the visit may have to be switched to a telephone visit. With either a video or telephone visit, we are not always able to ensure that we have a secure connection.  By engaging in this virtual visit, you consent to the provision of healthcare and authorize for your insurance to be billed (if applicable) for the services provided during this visit. Depending on your insurance coverage, you may receive a charge related to this service.  I need to obtain your verbal consent now. Are you willing to proceed with your visit today? Ellen Hood has provided verbal consent on 04/30/2023 for a virtual visit (video or telephone). Ellen Curio, FNP  Date: 04/30/2023 9:01 AM  Virtual Visit via Video Note   I, Ellen Hood, connected with  Ellen Hood  ([id number], 26-Oct-1970) on 04/30/23 at  9:00 AM EST by a video-enabled telemedicine application and verified that I am speaking with the correct person using two identifiers.  Location: Patient: Virtual Visit Location Patient:  Home Provider: Virtual Visit Location Provider: Home Office   I discussed the limitations of evaluation and management by telemedicine and the availability of in person appointments. The patient expressed understanding and agreed to proceed.    History of Present Illness: Ellen Hood is a 52 y.o. who identifies as a female who was assigned female at birth, and is being seen today for burning frequency and low back pain on urination. No fever. History of pyelonephritis. Marland Kitchen  HPI: HPI  Problems:  Patient Active Problem List   Diagnosis Date Noted   Dyspnea 11/28/2014   Pulmonary embolism (HCC) 11/27/2014    Allergies:  Allergies  Allergen Reactions   Topiramate Other (See Comments)    Other reaction(s): Confusion  And weight gain  Other reaction(s): Confusion  And weight gain  And weight gain   Medications:  Current Outpatient Medications:    albuterol (PROVENTIL HFA;VENTOLIN HFA) 108 (90 BASE) MCG/ACT inhaler, Inhale into the lungs., Disp: , Rfl:    amphetamine-dextroamphetamine (ADDERALL) 20 MG tablet, Take 1 tablet (20 mg total) by mouth 2 (two) times daily., Disp: 60 tablet, Rfl: 0   cephALEXin (KEFLEX) 500 MG capsule, 2 caps po bid x 7 days, Disp: 28 capsule, Rfl: 0   diphenhydrAMINE (BENADRYL) 25 MG tablet, Take 25 mg by mouth at bedtime.  , Disp: , Rfl:    naproxen (NAPROSYN) 500 MG tablet, Take 1 tablet (500 mg total) by mouth 2 (two) times daily., Disp: 30 tablet, Rfl: 0   ondansetron (ZOFRAN-ODT) 4 MG disintegrating tablet,  4mg  ODT q4 hours prn nausea/vomit, Disp: 20 tablet, Rfl: 0  Observations/Objective: Patient is well-developed, well-nourished in no acute distress.  Resting comfortably  at home.  Head is normocephalic, atraumatic.  No labored breathing.  Speech is clear and coherent with logical content.  Patient is alert and oriented at baseline.    Assessment and Plan: 1. Urinary tract infection without hematuria, site unspecified  Increase fluids,  drinking coke for migraines and will stop that, UC if sx persist or worsen.   Follow Up Instructions: I discussed the assessment and treatment plan with the patient. The patient was provided an opportunity to ask questions and all were answered. The patient agreed with the plan and demonstrated an understanding of the instructions.  A copy of instructions were sent to the patient via MyChart unless otherwise noted below.     The patient was advised to call back or seek an in-person evaluation if the symptoms worsen or if the condition fails to improve as anticipated.    Ellen Curio, FNP

## 2023-04-30 NOTE — Patient Instructions (Signed)

## 2023-08-14 ENCOUNTER — Encounter (HOSPITAL_BASED_OUTPATIENT_CLINIC_OR_DEPARTMENT_OTHER): Payer: Self-pay | Admitting: Emergency Medicine

## 2023-08-14 ENCOUNTER — Emergency Department (HOSPITAL_BASED_OUTPATIENT_CLINIC_OR_DEPARTMENT_OTHER)
Admission: EM | Admit: 2023-08-14 | Discharge: 2023-08-14 | Discharge status: Against Medical Advice | Attending: Emergency Medicine | Admitting: Emergency Medicine

## 2023-08-14 ENCOUNTER — Emergency Department (HOSPITAL_BASED_OUTPATIENT_CLINIC_OR_DEPARTMENT_OTHER)

## 2023-08-14 ENCOUNTER — Other Ambulatory Visit: Payer: Self-pay

## 2023-08-14 DIAGNOSIS — R519 Headache, unspecified: Secondary | ICD-10-CM | POA: Insufficient documentation

## 2023-08-14 DIAGNOSIS — R42 Dizziness and giddiness: Secondary | ICD-10-CM | POA: Insufficient documentation

## 2023-08-14 DIAGNOSIS — Z5321 Procedure and treatment not carried out due to patient leaving prior to being seen by health care provider: Secondary | ICD-10-CM | POA: Insufficient documentation

## 2023-08-14 LAB — CBC
HCT: 39.4 % (ref 36.0–46.0)
Hemoglobin: 12.9 g/dL (ref 12.0–15.0)
MCH: 27.9 pg (ref 26.0–34.0)
MCHC: 32.7 g/dL (ref 30.0–36.0)
MCV: 85.3 fL (ref 80.0–100.0)
Platelets: 350 10*3/uL (ref 150–400)
RBC: 4.62 MIL/uL (ref 3.87–5.11)
RDW: 14.7 % (ref 11.5–15.5)
WBC: 14.1 10*3/uL — ABNORMAL HIGH (ref 4.0–10.5)
nRBC: 0 % (ref 0.0–0.2)

## 2023-08-14 LAB — BASIC METABOLIC PANEL
Anion gap: 7 (ref 5–15)
BUN: 21 mg/dL — ABNORMAL HIGH (ref 6–20)
CO2: 27 mmol/L (ref 22–32)
Calcium: 9.4 mg/dL (ref 8.9–10.3)
Chloride: 105 mmol/L (ref 98–111)
Creatinine, Ser: 0.89 mg/dL (ref 0.44–1.00)
GFR, Estimated: >60 mL/min (ref >60)
Glucose, Bld: 101 mg/dL — ABNORMAL HIGH (ref 70–99)
Potassium: 3.6 mmol/L (ref 3.5–5.1)
Sodium: 139 mmol/L (ref 135–145)

## 2023-08-14 NOTE — ED Triage Notes (Addendum)
 PT POV steady gait- c/o sudden headache ("felt like brain freeze"), dizziness (like room is spinning) started appx 1.5 hr PTA.  Denies known sick contacts,   Hx of migraines, feels different.

## 2024-03-01 ENCOUNTER — Encounter (HOSPITAL_BASED_OUTPATIENT_CLINIC_OR_DEPARTMENT_OTHER): Payer: Self-pay

## 2024-03-01 ENCOUNTER — Emergency Department (HOSPITAL_BASED_OUTPATIENT_CLINIC_OR_DEPARTMENT_OTHER): Admission: EM | Admit: 2024-03-01 | Discharge: 2024-03-01 | Disposition: A | Discharge status: Home / Self Care

## 2024-03-01 ENCOUNTER — Emergency Department (HOSPITAL_BASED_OUTPATIENT_CLINIC_OR_DEPARTMENT_OTHER)

## 2024-03-01 ENCOUNTER — Other Ambulatory Visit: Payer: Self-pay

## 2024-03-01 DIAGNOSIS — R519 Headache, unspecified: Secondary | ICD-10-CM | POA: Insufficient documentation

## 2024-03-01 LAB — CBC
HCT: 39.5 % (ref 36.0–46.0)
Hemoglobin: 12.7 g/dL (ref 12.0–15.0)
MCH: 27.9 pg (ref 26.0–34.0)
MCHC: 32.2 g/dL (ref 30.0–36.0)
MCV: 86.6 fL (ref 80.0–100.0)
Platelets: 307 K/uL (ref 150–400)
RBC: 4.56 MIL/uL (ref 3.87–5.11)
RDW: 15 % (ref 11.5–15.5)
WBC: 6.7 K/uL (ref 4.0–10.5)
nRBC: 0 % (ref 0.0–0.2)

## 2024-03-01 LAB — BASIC METABOLIC PANEL WITH GFR
Anion gap: 10 (ref 5–15)
BUN: 15 mg/dL (ref 6–20)
CO2: 28 mmol/L (ref 22–32)
Calcium: 9.3 mg/dL (ref 8.9–10.3)
Chloride: 104 mmol/L (ref 98–111)
Creatinine, Ser: 0.79 mg/dL (ref 0.44–1.00)
GFR, Estimated: >60 mL/min (ref >60)
Glucose, Bld: 102 mg/dL — ABNORMAL HIGH (ref 70–99)
Potassium: 4.1 mmol/L (ref 3.5–5.1)
Sodium: 141 mmol/L (ref 135–145)

## 2024-03-01 MED ORDER — DIPHENHYDRAMINE HCL 50 MG/ML IJ SOLN
25.0000 mg | Freq: Once | INTRAMUSCULAR | Status: AC
  Administered 2024-03-01: 25 mg via INTRAVENOUS
  Filled 2024-03-01: qty 1

## 2024-03-01 MED ORDER — MECLIZINE HCL 25 MG PO TABS
25.0000 mg | ORAL_TABLET | Freq: Once | ORAL | Status: AC
  Administered 2024-03-01: 25 mg via ORAL
  Filled 2024-03-01: qty 1

## 2024-03-01 MED ORDER — LACTATED RINGERS IV BOLUS
1000.0000 mL | Freq: Once | INTRAVENOUS | Status: AC
  Administered 2024-03-01: 1000 mL via INTRAVENOUS

## 2024-03-01 MED ORDER — PROCHLORPERAZINE EDISYLATE 10 MG/2ML IJ SOLN
10.0000 mg | Freq: Once | INTRAMUSCULAR | Status: AC
  Administered 2024-03-01: 10 mg via INTRAVENOUS
  Filled 2024-03-01: qty 2

## 2024-03-01 MED ORDER — KETOROLAC TROMETHAMINE 15 MG/ML IJ SOLN
15.0000 mg | Freq: Once | INTRAMUSCULAR | Status: AC
  Administered 2024-03-01: 15 mg via INTRAVENOUS
  Filled 2024-03-01: qty 1

## 2024-03-01 NOTE — Discharge Instructions (Signed)
 Your workup today was reassuring.  I think that your symptoms were from migraine headache.  Please follow-up with your doctor and return to the emergency department if you develop any concerning symptoms.

## 2024-03-01 NOTE — ED Provider Notes (Signed)
 [place] [facility] Provider Note   CSN: [id number] Arrival date & time: 03/01/24  1117     Patient presents with: Headache   Ellen Hood is a 53 y.o. female.   53 year old female with past medical history of migraine headaches in the past presenting to the emergency department today with headache and dizziness.  The patient states she woke up with a frontal throbbing headache similar to previous migraines.  She states that she also having some disequilibrium/dizziness with this.  Reports that she normally does not experience the dizziness with her headaches but the pain is similar.  She denies any focal weakness, numbness, or tingling.  She came to the ER for further evaluation regarding this.  She did note that her blood pressure was elevated this morning.   Headache      Prior to Admission medications   Medication Sig Start Date End Date Taking? Authorizing Provider  albuterol (PROVENTIL HFA;VENTOLIN HFA) 108 (90 BASE) MCG/ACT inhaler Inhale into the lungs. 11/30/14 11/30/15  [provider]  amphetamine -dextroamphetamine  (ADDERALL) 20 MG tablet Take 1 tablet (20 mg total) by mouth 2 (two) times daily. 06/02/20   Mozingo, Regina Nattalie, NP  cephALEXin  (KEFLEX ) 500 MG capsule 2 caps po bid x 7 days 03/29/22   Emil Share, DO  diphenhydrAMINE  (BENADRYL ) 25 MG tablet Take 25 mg by mouth at bedtime.      [provider]  naproxen  (NAPROSYN ) 500 MG tablet Take 1 tablet (500 mg total) by mouth 2 (two) times daily. 06/24/17   Petrucelli, Samantha R, PA-C  ondansetron  (ZOFRAN -ODT) 4 MG disintegrating tablet 4mg  ODT q4 hours prn nausea/vomit 03/29/22   Floyd, Dan, DO    Allergies: Topiramate     Review of Systems  Neurological:  Positive for headaches.  All other systems reviewed and are negative.   Updated Vital Signs BP 136/80   Pulse 77   Temp 97.6 F (36.4 C) (Oral)   Resp 16   SpO2 98%   Physical Exam Vitals and  nursing note reviewed.   Gen: NAD Eyes: PERRL, EOMI HEENT: no oropharyngeal swelling Neck: trachea midline Resp: clear to auscultation bilaterally Card: RRR, no murmurs, rubs, or gallops Abd: nontender, nondistended Extremities: no calf tenderness, no edema Vascular: 2+ radial pulses bilaterally, 2+ DP pulses bilaterally Neuro: Cranial nerves intact, equal strength and sensation throughout bilateral upper and lower extremities with no dysmetria on finger-to-nose testing, no nystagmus noted Skin: no rashes Psyc: acting appropriately   (all labs ordered are listed, but only abnormal results are displayed) Labs Reviewed  BASIC METABOLIC PANEL WITH GFR - Abnormal; Notable for the following components:      Result Value   Glucose, Bld 102 (*)    All other components within normal limits  CBC    EKG: EKG Interpretation Date/Time:  Friday March 01 2024 11:26:01 EDT Ventricular Rate:  76 PR Interval:  149 QRS Duration:  104 QT Interval:  483 QTC Calculation: 544 R Axis:   65  Text Interpretation: Sinus rhythm Low voltage, precordial leads Nonspecific T abnrm, anterolateral leads Prolonged QT interval Confirmed by Ula Barter [phone]) on 03/01/2024 12:00:27 PM  Radiology: CT Head Wo Contrast Result Date: 03/01/2024 EXAM: CT HEAD WITHOUT CONTRAST 03/01/2024 12:46:04 PM TECHNIQUE: CT of the head was performed without the administration of intravenous contrast. Automated exposure control, iterative reconstruction, and/or weight based adjustment of the mA/kV was utilized to reduce the radiation dose to as low as reasonably achievable. COMPARISON: Head CT  08/14/2023. CLINICAL HISTORY: 53 year old female. Headache, increasing frequency or severity. FINDINGS: BRAIN AND VENTRICLES: No acute hemorrhage. No evidence of acute infarct. No hydrocephalus. No extra-axial collection. No mass effect or midline shift. Normal brain volume. No suspicious intracranial vascular hyperdensity. ORBITS: Mildly  disconjugated days, nonspecific. SINUSES: No acute abnormality. SOFT TISSUES AND SKULL: No acute soft tissue abnormality. No skull fracture. 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 IMPRESSION: 1. Stable and negative non-contrast head CT. Electronically signed by: Helayne Hurst MD 03/01/2024 12:53 PM EDT RP Workstation: [id number]     Procedures   Medications Ordered in the ED  prochlorperazine  (COMPAZINE ) injection 10 mg (10 mg Intravenous Given 03/01/24 1205)  diphenhydrAMINE  (BENADRYL ) injection 25 mg (25 mg Intravenous Given 03/01/24 1204)  lactated ringers  bolus 1,000 mL (0 mLs Intravenous Stopped 03/01/24 1349)  meclizine (ANTIVERT) tablet 25 mg (25 mg Oral Given 03/01/24 1203)  ketorolac  (TORADOL ) 15 MG/ML injection 15 mg (15 mg Intravenous Given 03/01/24 1446)                                    Medical Decision Making 53 year old female with past medical history of migraine headaches presenting to the emergency department today with headache consistent with previous migraine headaches.  Given her elevated blood pressure will obtain CT scan of her head to eval for intracranial hemorrhage.  Based on description of her symptoms this does seem similar to previous headache suspicion for subarachnoid hemorrhage is relatively low at this time she reports is gradually worsened throughout the morning.  I will give the patient Compazine  and Benadryl  here and as well as liquids and Compazine .  She does have a history of complex migraines this may be consistent with this.  I will reevaluate for ultimate disposition.  Given the mildly elevated blood pressure if her symptoms did not improve/resolve with the above treatment she may require MRI.  The patient CT scan and blood work are unremarkable.  Her dizziness resolved after the medications here.  She is still having headache and was given Toradol .  Her headache resolved after this.  She is feeling better  and is ready to go home.  She is discharged with return precautions.  Amount and/or Complexity of Data Reviewed Labs: ordered. Radiology: ordered.  Risk Prescription drug management.        Final diagnoses:  Nonintractable headache, unspecified chronicity pattern, unspecified headache type    ED Discharge Orders     None          Ula Prentice SAUNDERS, MD 03/01/24 1547

## 2024-03-01 NOTE — ED Triage Notes (Signed)
 Headache, N/V, dizziness since this morning. States systolic BP was in 150's at home.   Denies chest pain, SHOB Not on BP meds
